# Patient Record
Sex: Female | Born: 1970 | Race: White | Hispanic: Yes | Smoking: Never smoker
Health system: Southern US, Community
[De-identification: ages and names within clinical notes are randomized; demographics above are authoritative.]

## PROBLEM LIST (undated history)

## (undated) DIAGNOSIS — G43909 Migraine, unspecified, not intractable, without status migrainosus: Secondary | ICD-10-CM

## (undated) DIAGNOSIS — Z87442 Personal history of urinary calculi: Secondary | ICD-10-CM

## (undated) DIAGNOSIS — I10 Essential (primary) hypertension: Secondary | ICD-10-CM

## (undated) DIAGNOSIS — F419 Anxiety disorder, unspecified: Secondary | ICD-10-CM

## (undated) DIAGNOSIS — R600 Localized edema: Secondary | ICD-10-CM

## (undated) DIAGNOSIS — C50919 Malignant neoplasm of unspecified site of unspecified female breast: Secondary | ICD-10-CM

## (undated) DIAGNOSIS — M549 Dorsalgia, unspecified: Secondary | ICD-10-CM

## (undated) DIAGNOSIS — R079 Chest pain, unspecified: Secondary | ICD-10-CM

## (undated) DIAGNOSIS — R7303 Prediabetes: Secondary | ICD-10-CM

## (undated) DIAGNOSIS — E559 Vitamin D deficiency, unspecified: Secondary | ICD-10-CM

## (undated) DIAGNOSIS — K5900 Constipation, unspecified: Secondary | ICD-10-CM

## (undated) DIAGNOSIS — R0602 Shortness of breath: Secondary | ICD-10-CM

## (undated) DIAGNOSIS — M2550 Pain in unspecified joint: Secondary | ICD-10-CM

## (undated) HISTORY — DX: Chest pain, unspecified: R07.9

## (undated) HISTORY — DX: Vitamin D deficiency, unspecified: E55.9

## (undated) HISTORY — PX: OTHER SURGICAL HISTORY: SHX169

## (undated) HISTORY — DX: Shortness of breath: R06.02

## (undated) HISTORY — DX: Migraine, unspecified, not intractable, without status migrainosus: G43.909

## (undated) HISTORY — DX: Dorsalgia, unspecified: M54.9

## (undated) HISTORY — DX: Malignant neoplasm of unspecified site of unspecified female breast: C50.919

## (undated) HISTORY — DX: Essential (primary) hypertension: I10

## (undated) HISTORY — DX: Localized edema: R60.0

## (undated) HISTORY — PX: ABDOMINAL HYSTERECTOMY: SHX81

## (undated) HISTORY — DX: Constipation, unspecified: K59.00

## (undated) HISTORY — DX: Pain in unspecified joint: M25.50

---

## 2016-05-05 ENCOUNTER — Ambulatory Visit: Payer: Self-pay | Admitting: Family Medicine

## 2017-10-21 DIAGNOSIS — G43909 Migraine, unspecified, not intractable, without status migrainosus: Secondary | ICD-10-CM | POA: Insufficient documentation

## 2017-11-18 DIAGNOSIS — E559 Vitamin D deficiency, unspecified: Secondary | ICD-10-CM | POA: Insufficient documentation

## 2017-11-18 DIAGNOSIS — R7303 Prediabetes: Secondary | ICD-10-CM | POA: Insufficient documentation

## 2017-11-18 DIAGNOSIS — I10 Essential (primary) hypertension: Secondary | ICD-10-CM | POA: Insufficient documentation

## 2018-06-08 DIAGNOSIS — K912 Postsurgical malabsorption, not elsewhere classified: Secondary | ICD-10-CM | POA: Insufficient documentation

## 2018-06-08 DIAGNOSIS — Z903 Acquired absence of stomach [part of]: Secondary | ICD-10-CM | POA: Insufficient documentation

## 2020-12-28 ENCOUNTER — Other Ambulatory Visit: Payer: Self-pay | Admitting: Internal Medicine

## 2020-12-28 DIAGNOSIS — Z1231 Encounter for screening mammogram for malignant neoplasm of breast: Secondary | ICD-10-CM

## 2021-01-28 ENCOUNTER — Encounter: Payer: Self-pay | Admitting: Family Medicine

## 2021-02-19 ENCOUNTER — Inpatient Hospital Stay: Admission: RE | Admit: 2021-02-19 | Payer: Self-pay | Source: Ambulatory Visit

## 2021-04-15 ENCOUNTER — Other Ambulatory Visit: Payer: Self-pay

## 2021-04-15 ENCOUNTER — Ambulatory Visit
Admission: RE | Admit: 2021-04-15 | Discharge: 2021-04-15 | Disposition: A | Discharge status: Home / Self Care | Payer: 59 | Source: Ambulatory Visit | Attending: Internal Medicine | Admitting: Internal Medicine

## 2021-04-15 DIAGNOSIS — Z1231 Encounter for screening mammogram for malignant neoplasm of breast: Secondary | ICD-10-CM

## 2021-04-16 ENCOUNTER — Other Ambulatory Visit: Payer: Self-pay | Admitting: Internal Medicine

## 2021-04-16 DIAGNOSIS — R928 Other abnormal and inconclusive findings on diagnostic imaging of breast: Secondary | ICD-10-CM

## 2021-05-07 ENCOUNTER — Ambulatory Visit: Payer: 59

## 2021-05-07 ENCOUNTER — Other Ambulatory Visit: Payer: Self-pay

## 2021-05-07 ENCOUNTER — Ambulatory Visit
Admission: RE | Admit: 2021-05-07 | Discharge: 2021-05-07 | Disposition: A | Discharge status: Home / Self Care | Payer: 59 | Source: Ambulatory Visit | Attending: Internal Medicine | Admitting: Internal Medicine

## 2021-05-07 ENCOUNTER — Other Ambulatory Visit: Payer: Self-pay | Admitting: Internal Medicine

## 2021-05-07 DIAGNOSIS — R928 Other abnormal and inconclusive findings on diagnostic imaging of breast: Secondary | ICD-10-CM

## 2021-05-07 DIAGNOSIS — R921 Mammographic calcification found on diagnostic imaging of breast: Secondary | ICD-10-CM

## 2021-05-15 ENCOUNTER — Ambulatory Visit
Admission: RE | Admit: 2021-05-15 | Discharge: 2021-05-15 | Disposition: A | Discharge status: Home / Self Care | Payer: 59 | Source: Ambulatory Visit | Attending: Internal Medicine | Admitting: Internal Medicine

## 2021-05-15 ENCOUNTER — Other Ambulatory Visit: Payer: Self-pay

## 2021-05-15 ENCOUNTER — Other Ambulatory Visit: Payer: Self-pay | Admitting: Internal Medicine

## 2021-05-15 DIAGNOSIS — R921 Mammographic calcification found on diagnostic imaging of breast: Secondary | ICD-10-CM

## 2021-05-15 HISTORY — PX: BREAST LUMPECTOMY: SHX2

## 2021-05-16 ENCOUNTER — Other Ambulatory Visit: Payer: Self-pay | Admitting: Internal Medicine

## 2021-05-16 ENCOUNTER — Encounter: Payer: Self-pay | Admitting: *Deleted

## 2021-05-16 DIAGNOSIS — D0512 Intraductal carcinoma in situ of left breast: Secondary | ICD-10-CM | POA: Insufficient documentation

## 2021-05-16 DIAGNOSIS — C50912 Malignant neoplasm of unspecified site of left female breast: Secondary | ICD-10-CM

## 2021-05-17 ENCOUNTER — Telehealth: Payer: Self-pay | Admitting: General Surgery

## 2021-05-17 ENCOUNTER — Other Ambulatory Visit: Payer: Self-pay | Admitting: Internal Medicine

## 2021-05-17 DIAGNOSIS — C50912 Malignant neoplasm of unspecified site of left female breast: Secondary | ICD-10-CM

## 2021-05-17 NOTE — Telephone Encounter (Signed)
Spoke to patient with an interpreter to confirm morning [facility] appointment on 6/8, paper work emailed to patient

## 2021-05-20 ENCOUNTER — Ambulatory Visit
Admission: RE | Admit: 2021-05-20 | Discharge: 2021-05-20 | Disposition: A | Discharge status: Home / Self Care | Payer: 59 | Source: Ambulatory Visit | Attending: Internal Medicine | Admitting: Internal Medicine

## 2021-05-20 ENCOUNTER — Other Ambulatory Visit: Payer: Self-pay

## 2021-05-20 ENCOUNTER — Other Ambulatory Visit: Payer: Self-pay | Admitting: *Deleted

## 2021-05-20 DIAGNOSIS — C50912 Malignant neoplasm of unspecified site of left female breast: Secondary | ICD-10-CM

## 2021-05-20 DIAGNOSIS — D0512 Intraductal carcinoma in situ of left breast: Secondary | ICD-10-CM

## 2021-05-20 MED ORDER — GADOBUTROL 1 MMOL/ML IV SOLN
10.0000 mL | Freq: Once | INTRAVENOUS | Status: AC | PRN
  Administered 2021-05-20: 10 mL via INTRAVENOUS

## 2021-05-20 NOTE — Progress Notes (Signed)
[place]   [phone] [phone] [place] Note   Patient Care Team: Pcp, No as PCP - General Rockwell Germany, RN as Oncology Nurse Navigator Mauro Kaufmann, RN as Oncology Nurse Navigator Truitt Merle, MD as Consulting Physician (Hematology) Stark Klein, MD as Consulting Physician (General Surgery) Eppie Gibson, MD as Attending Physician (Radiation Oncology)  Date of Service:  05/22/2021   CHIEF COMPLAINTS/PURPOSE OF CONSULTATION:  Newly diagnosed left breast DCIS   Oncology History Overview Note  Cancer Staging Ductal carcinoma in situ (DCIS) of left breast Staging form: Breast, AJCC 8th Edition - Clinical stage from 05/15/2021: Stage 0 (cTis (DCIS), cN0, cM0, G3, ER+, PR-, HER2: Not Assessed) - Signed by Truitt Merle, MD on 05/21/2021 Stage prefix: Initial diagnosis Histologic grading system: 3 grade system    Ductal carcinoma in situ (DCIS) of left breast  05/07/2021 Mammogram   IMPRESSION: 1. There is a 5 cm group of suspicious calcifications in the lower-inner quadrant of the left breast.   2. There are 2 small adjacent groups of calcifications in the upper-outer quadrant of the right breast measuring 0.6 and 0.5 cm respectively, which may represent fibrocystic changes.   05/15/2021 Initial Biopsy   Diagnosis 1. Breast, left, needle core biopsy, left breast LIQ anterior - DUCTAL CARCINOMA IN SITU WITH CALCIFICATIONS AND NECROSIS - SEE COMMENT 2. Breast, left, needle core biopsy, left breast LIQ posterior - DUCTAL CARCINOMA IN SITU WITH CALCIFICATIONS AND NECROSIS - SEE COMMENT Microscopic Comment 1. and 2. Based on the biopsy, the ductal carcinoma in situ has a comedo pattern, high nuclear grade and measures 0.2 cm in greatest linear extent. Prognostic markers (ER/PR) are pending and will be reported in an addendum. Dr. Saralyn Pilar reviewed the case and agrees with the above diagnosis. These results were called to [facility] on May 16, 2021.   05/15/2021 Cancer Staging   Staging form: Breast, AJCC 8th Edition - Clinical stage from 05/15/2021: Stage 0 (cTis (DCIS), cN0, cM0, G3, ER+, PR-, HER2: Not Assessed) - Signed by Truitt Merle, MD on 05/21/2021 Stage prefix: Initial diagnosis Histologic grading system: 3 grade system   05/15/2021 Receptors her2   ADDITIONAL INFORMATION: 1. PROGNOSTIC INDICATORS Results: IMMUNOHISTOCHEMICAL AND MORPHOMETRIC ANALYSIS PERFORMED MANUALLY Estrogen Receptor: 30%, POSITIVE, WEAK STAINING INTENSITY Progesterone Receptor: 0%, NEGATIVE   05/16/2021 Initial Diagnosis   Ductal carcinoma in situ (DCIS) of left breast   05/20/2021 Imaging   MRI breast IMPRESSION: Non masslike enhancement in the anterior aspect of the left breast consistent with post biopsy change/hematoma. No additional abnormal enhancement in the left breast is identified. No abnormal enhancement in the right breast.   05/23/2021 Pathology Results   PENDING RIGHT BREAST BIOPSY      HISTORY OF PRESENTING ILLNESS:  Destiny Little 50 y.o. female is a here because of newly diagnosed left breast DCIS. The patient presents to the Breast clinic today accompanied by Spanish interpretor Gregary Signs.   Her Left breast mass was found by screening mass. She did not feel the mass herself. She denies history of abnormal mammogram. She notes last year was her first mammogram. She denies family history of breast cancer. She is overall at baseline with no pain. She only notes "hot flashes" in her lower legs. She notes she is anxious about the unknown with this diagnosis. With more information she is calmer. She declined speaking with SW for now.   She denies significant PMHx other than HTN. I  reviewed her medication list with her. She has 3 C-sections and Bypass surgery 4 years ago. She was able to lose significant weight afterward. She had hysterectomy due to heavy bleeding and enlarged uterus. She still has ovaries. Her  mother passed at age 39 from medical neglect in [place].   Socially she is separated from her husband. She has 3 children. She does not smoke, drink. She works as a Secretary/administrator, currently at [employer]. She notes she has insurance.     GYN HISTORY  Menarchal: 13 LMP: S/p Hysterectomy, still has ovaries.  Contraceptive: No HRT: No G3P3: first at age 20, breast fed for 6-12 months.    REVIEW OF SYSTEMS:    Constitutional: Denies fevers, chills or abnormal night sweats Eyes: Denies blurriness of vision, double vision or watery eyes Ears, nose, mouth, throat, and face: Denies mucositis or sore throat Respiratory: Denies cough, dyspnea or wheezes Cardiovascular: Denies palpitation, chest discomfort or lower extremity swelling Gastrointestinal:  Denies nausea, heartburn or change in bowel habits Skin: Denies abnormal skin rashes Lymphatics: Denies new lymphadenopathy or easy bruising Neurological:Denies numbness, tingling or new weaknesses Behavioral/Psych: Mood is stable, no new changes  All other systems were reviewed with the patient and are negative.   MEDICAL HISTORY:  Past Medical History:  Diagnosis Date  . Breast cancer ([place])   . Hypertension   . Migraines     SURGICAL HISTORY: Past Surgical History:  Procedure Laterality Date  . ABDOMINAL HYSTERECTOMY    . CESAREAN SECTION     x3  . gastic bypass      SOCIAL HISTORY: Social History   Socioeconomic History  . Marital status: Legally Separated    Spouse name: Not on file  . Number of children: 3  . Years of education: Not on file  . Highest education level: Not on file  Occupational History  . Occupation: house keeping     Employer: [employer]  Tobacco Use  . Smoking status: Never Smoker  . Smokeless tobacco: Never Used  Substance and Sexual Activity  . Alcohol use: Never  . Drug use: Never  . Sexual activity: Not on file  Other Topics Concern  . Not on file  Social History Narrative  .  Not on file   Social Determinants of Health   Financial Resource Strain: Not on file  Food Insecurity: Not on file  Transportation Needs: Not on file  Physical Activity: Not on file  Stress: Not on file  Social Connections: Not on file  Intimate Partner Violence: Not on file    FAMILY HISTORY: Family History  Problem Relation Age of Onset  . Diabetes Father   . Hypertension Father     ALLERGIES:  has no allergies on file.  MEDICATIONS:  Current Outpatient Medications  Medication Sig Dispense Refill  . ibuprofen (ADVIL) 800 MG tablet Take 800 mg by mouth 3 (three) times daily as needed.    Marland Kitchen losartan-hydrochlorothiazide (HYZAAR) 50-12.5 MG tablet Take 1 tablet by mouth daily.     No current facility-administered medications for this visit.    PHYSICAL EXAMINATION: ECOG PERFORMANCE STATUS: 0 - Asymptomatic  Vitals:   05/22/21 0906  BP: 130/76  Pulse: 66  Resp: 18  Temp: 97.7 F (36.5 C)  SpO2: 100%   Filed Weights   05/22/21 0906  Weight: 183 lb 14.4 oz (83.4 kg)    GENERAL:alert, no distress and comfortable SKIN: skin color, texture, turgor are normal, no rashes or significant lesions EYES: normal, Conjunctiva  are pink and non-injected, sclera clear  NECK: supple, thyroid normal size, non-tender, without nodularity LYMPH:  no palpable lymphadenopathy in the cervical, axillary  LUNGS: clear to auscultation and percussion with normal breathing effort HEART: regular rate & rhythm and no murmurs and no lower extremity edema ABDOMEN:abdomen soft, non-tender and normal bowel sounds Musculoskeletal:no cyanosis of digits and no clubbing  NEURO: alert & oriented x 3 with fluent speech, no focal motor/sensory deficits BREAST: (+) Moderate skin ecchymosis at left breast biopsy with 2.5x1.5cm mass at 9:00 position lump, likely hematoma. Right Breast exam benign.  LABORATORY DATA:  I have reviewed the data as listed CBC Latest Ref Rng & Units 05/22/2021  WBC 4.0 - 10.5  K/uL 6.9  Hemoglobin 12.0 - 15.0 g/dL 12.8  Hematocrit 36.0 - 46.0 % 37.8  Platelets 150 - 400 K/uL 483(H)    CMP Latest Ref Rng & Units 05/22/2021  Glucose 70 - 99 mg/dL 140(H)  BUN 6 - 20 mg/dL 12  Creatinine 0.44 - 1.00 mg/dL 0.79  Sodium 135 - 145 mmol/L 139  Potassium 3.5 - 5.1 mmol/L 4.0  Chloride 98 - 111 mmol/L 104  CO2 22 - 32 mmol/L 24  Calcium 8.9 - 10.3 mg/dL 9.6  Total Protein 6.5 - 8.1 g/dL 7.6  Total Bilirubin 0.3 - 1.2 mg/dL 0.7  Alkaline Phos 38 - 126 U/L 64  AST 15 - 41 U/L 17  ALT 0 - 44 U/L 13     RADIOGRAPHIC STUDIES: I have personally reviewed the radiological images as listed and agreed with the findings in the report. MR BREAST BILATERAL W WO CONTRAST INC CAD  Result Date: 05/20/2021 CLINICAL DATA:  50 year old female with 2 areas of biopsy proven high-grade ductal carcinoma in-situ with calcifications and necrosis in the medial aspect of the left breast. Patient developed a moderate size hematoma following the biopsies. Two additional stereotactic biopsies of the right breast are scheduled on 05/23/2021. LABS:  None obtained at the time of imaging. EXAM: BILATERAL BREAST MRI WITH AND WITHOUT CONTRAST TECHNIQUE: Multiplanar, multisequence MR images of both breasts were obtained prior to and following the intravenous administration of 10 ml of Gadavist Three-dimensional MR images were rendered by post-processing of the original MR data on an independent workstation. The three-dimensional MR images were interpreted, and findings are reported in the following complete MRI report for this study. Three dimensional images were evaluated at the independent interpreting workstation using the DynaCAD thin client. COMPARISON:  Previous exam(s). FINDINGS: Breast composition: b. Scattered fibroglandular tissue. Background parenchymal enhancement: Moderate. Right breast: No mass or abnormal enhancement. Left breast: Biopsy changes are seen in the anterior aspect of the left breast.  There are 2 signal void artifacts anteriorly in the breast from the biopsies. There is asymmetric non masslike enhancement in the anterior aspect of the breast likely related to post biopsy change/hematoma. No additional abnormal enhancement is seen in the left breast. Lymph nodes: No abnormal appearing lymph nodes. Ancillary findings:  None. IMPRESSION: Non masslike enhancement in the anterior aspect of the left breast consistent with post biopsy change/hematoma. No additional abnormal enhancement in the left breast is identified. No abnormal enhancement in the right breast. RECOMMENDATION: Stereotactic biopsies of the right breast has been scheduled on 05/23/2021 for indeterminate calcifications. 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 BI-RADS CATEGORY  6: Known biopsy-proven malignancy. Electronically Signed   By: Lillia Mountain M.D.   On: 05/20/2021 12:39   MM DIAG  BREAST TOMO BILATERAL  Result Date: 05/07/2021 CLINICAL DATA:  Screening recall for bilateral breast calcifications. EXAM: DIGITAL DIAGNOSTIC BILATERAL MAMMOGRAM WITH TOMOSYNTHESIS AND CAD TECHNIQUE: Bilateral digital diagnostic mammography and breast tomosynthesis was performed. The images were evaluated with computer-aided detection. COMPARISON:  Previous exam(s). ACR Breast Density Category b: There are scattered areas of fibroglandular density. FINDINGS: In the lower-inner quadrant of the left breast, anterior depth, there is a 5 cm group of segmental amorphous and coarse calcifications. In the upper outer posterior right breast, spot compression magnification images reveal 2 adjacent groups of amorphous calcifications measuring 0.6 and 0.5 cm respectively. IMPRESSION: 1. There is a 5 cm group of suspicious calcifications in the lower-inner quadrant of the left breast. 2. There are 2 small adjacent groups of calcifications in the upper-outer quadrant of the right breast, which may  represent fibrocystic changes. RECOMMENDATION: Stereotactic biopsy is recommended for the anterior and posterior margin of calcifications in the lower-inner left breast. If the biopsies are benign, than six-month follow-up right breast diagnostic mammogram is recommended to monitor the other 2 groups. If the biopsy is abnormal, than a double stereotactic biopsy of the right breast is recommended. The left breast biopsies have been scheduled for 05/15/2021 at 9:30 a.m. I have discussed the findings and recommendations with the patient. If applicable, a reminder letter will be sent to the patient regarding the next appointment. BI-RADS CATEGORY  4: Suspicious. Electronically Signed   By: Ammie Ferrier M.D.   On: 05/07/2021 15:00   MM CLIP PLACEMENT LEFT  Result Date: 05/15/2021 CLINICAL DATA:  Status post stereotactic biopsies of the left breast. EXAM: DIAGNOSTIC LEFT MAMMOGRAM POST STEREOTACTIC BIOPSIES COMPARISON:  Previous exam(s). FINDINGS: Mammographic images were obtained following stereotactic guided biopsies of the left breast. There is a ribbon shaped clip in the lower-inner quadrant of the left breast located approximately 1.5 cm lateral from the biopsied calcifications. There is a X shaped clip in appropriate position in the lower-inner quadrant of the left breast. The clips are located approximately 2.7 cm part. IMPRESSION: The ribbon shaped clip is located approximately 1.5 cm lateral to the biopsied calcifications. The X shaped clip is felt to be in good position. Final Assessment: Post Procedure Mammograms for Marker Placement Electronically Signed   By: Lillia Mountain M.D.   On: 05/15/2021 11:06   MM LT BREAST BX W LOC DEV 1ST LESION IMAGE BX SPEC STEREO GUIDE  Addendum Date: 05/20/2021   ADDENDUM REPORT: 05/17/2021 08:18 ADDENDUM: Pathology revealed HIGH GRADE DUCTAL CARCINOMA IN SITU WITH CALCIFICATIONS AND NECROSIS of the LEFT breast, lower inner quadrant, anterior, ribbon clip. This was  found to be concordant by Dr. Lillia Mountain. Pathology revealed HIGH GRADE DUCTAL CARCINOMA IN SITU WITH CALCIFICATIONS AND NECROSIS of the LEFT breast, lower inner quadrant, posterior, X clip. This was found to be concordant by Dr. Lillia Mountain. Pathology results were discussed with the patient by telephone with Kathrine Haddock- Bilingual Patient Services Representative. The patient reported doing well after the biopsies with tenderness at the sites. Post biopsy instructions and care were reviewed and questions were answered. The patient was encouraged to call [facility] for any additional concerns. The patient was referred to [facility] on May 22, 2021. The patient is scheduled 05/23/2021 for additional stereo biopsies of RIGHT breast. Consider MRI given high grade histology. Pathology results reported by Stacie Acres RN on 05/17/2021. Electronically Signed   By: Mordecai Rasmussen  Arceo M.D.   On: 05/17/2021 08:18   Result Date: 05/20/2021 CLINICAL DATA:  Suspicious left breast calcifications. Biopsy of the anterior and posterior extent of the calcifications recommended. EXAM: LEFT BREAST STEREOTACTIC CORE NEEDLE BIOPSIES COMPARISON:  Previous exams. FINDINGS: The patient and I discussed the procedure of stereotactic-guided biopsy including benefits and alternatives. We discussed the high likelihood of a successful procedure. We discussed the risks of the procedure including infection, bleeding, tissue injury, clip migration, and inadequate sampling. Informed written consent was given. The usual time out protocol was performed immediately prior to the procedure. Using sterile technique and 1% Lidocaine as local anesthetic, under stereotactic guidance, a 9 gauge vacuum assisted device was used to perform core needle biopsy of calcifications in the lower-inner quadrant of the left breast (anterior) using a medial to lateral  approach. Specimen radiograph was performed showing calcifications are in the tissue samples. Specimens with calcifications are identified for pathology. Lesion quadrant: Lower inner quadrant (anterior) At the conclusion of the procedure, ribbon shaped tissue marker clip was deployed into the biopsy cavity. Follow-up 2-view mammogram was performed and dictated separately. The patient and I discussed the procedure of stereotactic-guided biopsy including benefits and alternatives. We discussed the high likelihood of a successful procedure. We discussed the risks of the procedure including infection, bleeding, tissue injury, clip migration, and inadequate sampling. Informed written consent was given. The usual time out protocol was performed immediately prior to the procedure. Using sterile technique and 1% Lidocaine as local anesthetic, under stereotactic guidance, a 9 gauge vacuum assisted device was used to perform core needle biopsy of calcifications in the lower-inner quadrant of the left breast (posterior) using a medial to lateral approach. Specimen radiograph was performed showing calcifications are in the tissue samples. Specimens with calcifications are identified for pathology. Lesion quadrant: Lower inner quadrant (posterior) At the conclusion of the procedure, X shaped tissue marker clip was deployed into the biopsy cavity. Follow-up 2-view mammogram was performed and dictated separately. IMPRESSION: Stereotactic-guided biopsies of calcifications in the left breast. No apparent complications. Electronically Signed: By: Lillia Mountain M.D. On: 05/15/2021 10:41   MM LT BREAST BX W LOC DEV EA AD LESION IMG BX SPEC STEREO GUIDE  Addendum Date: 05/20/2021   ADDENDUM REPORT: 05/17/2021 08:18 ADDENDUM: Pathology revealed HIGH GRADE DUCTAL CARCINOMA IN SITU WITH CALCIFICATIONS AND NECROSIS of the LEFT breast, lower inner quadrant, anterior, ribbon clip. This was found to be concordant by Dr. Lillia Mountain. Pathology  revealed HIGH GRADE DUCTAL CARCINOMA IN SITU WITH CALCIFICATIONS AND NECROSIS of the LEFT breast, lower inner quadrant, posterior, X clip. This was found to be concordant by Dr. Lillia Mountain. Pathology results were discussed with the patient by telephone with Kathrine Haddock- Bilingual Patient Services Representative. The patient reported doing well after the biopsies with tenderness at the sites. Post biopsy instructions and care were reviewed and questions were answered. The patient was encouraged to call [facility] for any additional concerns. The patient was referred to [facility] on May 22, 2021. The patient is scheduled 05/23/2021 for additional stereo biopsies of RIGHT breast. Consider MRI given high grade histology. Pathology results reported by Stacie Acres RN on 05/17/2021. Electronically Signed   By: Lillia Mountain M.D.   On: 05/17/2021 08:18   Result Date: 05/20/2021 CLINICAL DATA:  Suspicious left breast calcifications. Biopsy of the anterior and posterior extent of the calcifications recommended. EXAM: LEFT BREAST STEREOTACTIC CORE  NEEDLE BIOPSIES COMPARISON:  Previous exams. FINDINGS: The patient and I discussed the procedure of stereotactic-guided biopsy including benefits and alternatives. We discussed the high likelihood of a successful procedure. We discussed the risks of the procedure including infection, bleeding, tissue injury, clip migration, and inadequate sampling. Informed written consent was given. The usual time out protocol was performed immediately prior to the procedure. Using sterile technique and 1% Lidocaine as local anesthetic, under stereotactic guidance, a 9 gauge vacuum assisted device was used to perform core needle biopsy of calcifications in the lower-inner quadrant of the left breast (anterior) using a medial to lateral approach. Specimen radiograph was performed showing  calcifications are in the tissue samples. Specimens with calcifications are identified for pathology. Lesion quadrant: Lower inner quadrant (anterior) At the conclusion of the procedure, ribbon shaped tissue marker clip was deployed into the biopsy cavity. Follow-up 2-view mammogram was performed and dictated separately. The patient and I discussed the procedure of stereotactic-guided biopsy including benefits and alternatives. We discussed the high likelihood of a successful procedure. We discussed the risks of the procedure including infection, bleeding, tissue injury, clip migration, and inadequate sampling. Informed written consent was given. The usual time out protocol was performed immediately prior to the procedure. Using sterile technique and 1% Lidocaine as local anesthetic, under stereotactic guidance, a 9 gauge vacuum assisted device was used to perform core needle biopsy of calcifications in the lower-inner quadrant of the left breast (posterior) using a medial to lateral approach. Specimen radiograph was performed showing calcifications are in the tissue samples. Specimens with calcifications are identified for pathology. Lesion quadrant: Lower inner quadrant (posterior) At the conclusion of the procedure, X shaped tissue marker clip was deployed into the biopsy cavity. Follow-up 2-view mammogram was performed and dictated separately. IMPRESSION: Stereotactic-guided biopsies of calcifications in the left breast. No apparent complications. Electronically Signed: By: Lillia Mountain M.D. On: 05/15/2021 10:41    ASSESSMENT & PLAN:  Destiny Little is a 50 y.o. [place] female with a history of HTN   1. Left breast DCIS, grade III, ER+/PR-  -I discussed her breast imaging and needle biopsy results with patient and her family members in great detail. She was found to have 5cm calcium deposits in her left breast. Biopsy showed this to be Ductal carcinoma IN SITU, high grad, ER 30%+ -She is a candidate  for breast conservation surgery. She has been seen by breast surgeon Dr. Barry Dienes, who recommends lumpectomy, but will wait for genetics testing result to see if she needs double mastectomy.  -Her DCIS will be cured by complete surgical resection. Any form of adjuvant therapy is preventive. -I reviewed her risk and treatment benefits using the Breast Cancer Nomogram from [facility]). Based on family, PMx and lifestyle she has a 17% risk of developing future breast cancer in the next 10 years. Her risk would drop to 7-8% with RT or Antiestrogen therapy alone. With both adjuvant treatments her risk would decrease to 3%.  -Given her positive ER in DCIS and her young age, I do recommend antiestrogen therapy with Tamoxifen, which decrease her risk of future breast cancer by half.  -She will likely benefit from breast radiation if she undergo lumpectomy to decrease the risk of breast cancer. She will discuss this further with Dr. Isidore Moos today.  -We also discussed that biopsy may have sampling limitation, we will review her surgical path, to see if she has any invasive carcinoma components. -She notes she is open to both  adjuvant Radiation and Antiestrogen therapy.  -We also discussed the breast cancer surveillance after her surgery. She will continue annual screening mammogram, self exams, and a routine office visit with lab and exam with [place]. I discussed the option of additional screening with annual breast MRIs. I also discussed Abbreviated MRIs which have $400 out-of-pocket cost if insurance does not cover this. -Labs today show CBC and CMP WNL except plt 483K, BG 140.  -Proceed with surgery soon. F/u after surgery or Radiation.  -she is scheduled for right breast biopsy soon, if it turned out to be DCIS, the treatment will be similar to left breast DCIS   2. Genetics  -Given her age, she is eligible for genetic testing. She is interested. -I discussed if she is found to be  BRCA+, Prophylactic b/l breast mastectomy would be recommended.    PLAN:  -Proceed with surgery soon  -F/u after surgery or Radiation    No orders of the defined types were placed in this encounter.   All questions were answered. The patient knows to call the clinic with any problems, questions or concerns. The total time spent in the appointment was 45 minutes.     Truitt Merle, MD 05/22/2021 10:23 AM  I, Joslyn Devon, am acting as scribe for Truitt Merle, MD.   I have reviewed the above documentation for accuracy and completeness, and I agree with the above.

## 2021-05-22 ENCOUNTER — Inpatient Hospital Stay: Payer: 59 | Attending: Hematology | Admitting: Hematology

## 2021-05-22 ENCOUNTER — Encounter: Payer: Self-pay | Admitting: *Deleted

## 2021-05-22 ENCOUNTER — Encounter: Payer: Self-pay | Admitting: Hematology

## 2021-05-22 ENCOUNTER — Ambulatory Visit (HOSPITAL_BASED_OUTPATIENT_CLINIC_OR_DEPARTMENT_OTHER): Payer: 59 | Admitting: Genetic Counselor

## 2021-05-22 ENCOUNTER — Encounter: Payer: Self-pay | Admitting: Radiation Oncology

## 2021-05-22 ENCOUNTER — Ambulatory Visit
Admission: RE | Admit: 2021-05-22 | Discharge: 2021-05-22 | Disposition: A | Discharge status: Home / Self Care | Payer: 59 | Source: Ambulatory Visit | Attending: Radiation Oncology | Admitting: Radiation Oncology

## 2021-05-22 ENCOUNTER — Inpatient Hospital Stay: Payer: 59

## 2021-05-22 ENCOUNTER — Encounter: Payer: Self-pay | Admitting: General Practice

## 2021-05-22 ENCOUNTER — Other Ambulatory Visit: Payer: Self-pay

## 2021-05-22 ENCOUNTER — Encounter: Payer: Self-pay | Admitting: Licensed Clinical Social Worker

## 2021-05-22 VITALS — BP 130/76 | HR 66 | Temp 97.7°F | Resp 18 | Wt 183.9 lb

## 2021-05-22 DIAGNOSIS — Z170 Estrogen receptor positive status [ER+]: Secondary | ICD-10-CM | POA: Diagnosis not present

## 2021-05-22 DIAGNOSIS — I10 Essential (primary) hypertension: Secondary | ICD-10-CM | POA: Diagnosis not present

## 2021-05-22 DIAGNOSIS — D0512 Intraductal carcinoma in situ of left breast: Secondary | ICD-10-CM | POA: Diagnosis present

## 2021-05-22 LAB — CBC WITH DIFFERENTIAL (CANCER CENTER ONLY)
Abs Immature Granulocytes: 0.02 10*3/uL (ref 0.00–0.07)
Basophils Absolute: 0 10*3/uL (ref 0.0–0.1)
Basophils Relative: 0 %
Eosinophils Absolute: 0 10*3/uL (ref 0.0–0.5)
Eosinophils Relative: 1 %
HCT: 37.8 % (ref 36.0–46.0)
Hemoglobin: 12.8 g/dL (ref 12.0–15.0)
Immature Granulocytes: 0 %
Lymphocytes Relative: 42 %
Lymphs Abs: 2.9 10*3/uL (ref 0.7–4.0)
MCH: 30.9 pg (ref 26.0–34.0)
MCHC: 33.9 g/dL (ref 30.0–36.0)
MCV: 91.3 fL (ref 80.0–100.0)
Monocytes Absolute: 0.4 10*3/uL (ref 0.1–1.0)
Monocytes Relative: 6 %
Neutro Abs: 3.6 10*3/uL (ref 1.7–7.7)
Neutrophils Relative %: 51 %
Platelet Count: 483 10*3/uL — ABNORMAL HIGH (ref 150–400)
RBC: 4.14 MIL/uL (ref 3.87–5.11)
RDW: 12.9 % (ref 11.5–15.5)
WBC Count: 6.9 10*3/uL (ref 4.0–10.5)
nRBC: 0 % (ref 0.0–0.2)

## 2021-05-22 LAB — CMP (CANCER CENTER ONLY)
ALT: 13 U/L (ref 0–44)
AST: 17 U/L (ref 15–41)
Albumin: 3.9 g/dL (ref 3.5–5.0)
Alkaline Phosphatase: 64 U/L (ref 38–126)
Anion gap: 11 (ref 5–15)
BUN: 12 mg/dL (ref 6–20)
CO2: 24 mmol/L (ref 22–32)
Calcium: 9.6 mg/dL (ref 8.9–10.3)
Chloride: 104 mmol/L (ref 98–111)
Creatinine: 0.79 mg/dL (ref 0.44–1.00)
GFR, Estimated: >60 mL/min (ref >60)
Glucose, Bld: 140 mg/dL — ABNORMAL HIGH (ref 70–99)
Potassium: 4 mmol/L (ref 3.5–5.1)
Sodium: 139 mmol/L (ref 135–145)
Total Bilirubin: 0.7 mg/dL (ref 0.3–1.2)
Total Protein: 7.6 g/dL (ref 6.5–8.1)

## 2021-05-22 LAB — GENETIC SCREENING ORDER

## 2021-05-22 NOTE — Progress Notes (Signed)
[facility] CSW Progress Notes  Call from daughter, Floreen Comber.  States mother will be out of work due to cancer diagnosis and treatment (surgery and radiation are planned).  This will result in loss of income as she does not have any short term disability or paid leave at her job.  She will experience financial distress.  Discussed various options - patient can apply for [employer] when she begins radiation treatment.  She can apply for various grants from agencies outside of [place] at this time - Restaurant manager, fast food and mailed this information to daughter Solicitor, [place], [place] in [place]).  Daughter will help mother review applications and assemble needed paperwork.  CSW will assist with submitting these applications when family is able to assemble needed supporting documentation.  Edwyna Shell, LCSW Clinical Social Worker Phone:  [phone]

## 2021-05-22 NOTE — Progress Notes (Signed)
REFERRING PROVIDER: Truitt Merle, MD [place],  [place] [address]  PRIMARY PROVIDER:  No PCP listed  PRIMARY REASON FOR VISIT:  1. Ductal carcinoma in situ (DCIS) of left breast     HISTORY OF PRESENT ILLNESS:   [name], a 50 y.o. female, was seen for a [place] cancer genetics consultation during the breast multidisciplinary clinic at the request of Dr. Burr Medico due to a personal history of cancer.  [name] presents to clinic today to discuss the possibility of a hereditary predisposition to cancer, to discuss genetic testing, and to further clarify her future cancer risks, as well as potential cancer risks for family members.   In 2022, at the age of 70, Destiny Little was diagnosed with ductal carcinoma in situ of the left breast.  Surgery plan is pending.  Biopsy of right breast is scheduled due to calcifications detected by imaging.   CANCER HISTORY:  Oncology History Overview Note  Cancer Staging Ductal carcinoma in situ (DCIS) of left breast Staging form: Breast, AJCC 8th Edition - Clinical stage from 05/15/2021: Stage 0 (cTis (DCIS), cN0, cM0, G3, ER+, PR-, HER2: Not Assessed) - Signed by Truitt Merle, MD on 05/21/2021 Stage prefix: Initial diagnosis Histologic grading system: 3 grade system    Ductal carcinoma in situ (DCIS) of left breast  05/07/2021 Mammogram   IMPRESSION: 1. There is a 5 cm group of suspicious calcifications in the lower-inner quadrant of the left breast.   2. There are 2 small adjacent groups of calcifications in the upper-outer quadrant of the right breast measuring 0.6 and 0.5 cm respectively, which may represent fibrocystic changes.   05/15/2021 Initial Biopsy   Diagnosis 1. Breast, left, needle core biopsy, left breast LIQ anterior - DUCTAL CARCINOMA IN SITU WITH CALCIFICATIONS AND NECROSIS - SEE COMMENT 2. Breast, left, needle core biopsy, left breast LIQ posterior - DUCTAL CARCINOMA IN SITU WITH CALCIFICATIONS AND NECROSIS -  SEE COMMENT Microscopic Comment 1. and 2. Based on the biopsy, the ductal carcinoma in situ has a comedo pattern, high nuclear grade and measures 0.2 cm in greatest linear extent. Prognostic markers (ER/PR) are pending and will be reported in an addendum. Dr. Saralyn Pilar reviewed the case and agrees with the above diagnosis. These results were called to [facility] on May 16, 2021.   05/15/2021 Cancer Staging   Staging form: Breast, AJCC 8th Edition - Clinical stage from 05/15/2021: Stage 0 (cTis (DCIS), cN0, cM0, G3, ER+, PR-, HER2: Not Assessed) - Signed by Truitt Merle, MD on 05/21/2021 Stage prefix: Initial diagnosis Histologic grading system: 3 grade system   05/15/2021 Receptors her2   ADDITIONAL INFORMATION: 1. PROGNOSTIC INDICATORS Results: IMMUNOHISTOCHEMICAL AND MORPHOMETRIC ANALYSIS PERFORMED MANUALLY Estrogen Receptor: 30%, POSITIVE, WEAK STAINING INTENSITY Progesterone Receptor: 0%, NEGATIVE   05/16/2021 Initial Diagnosis   Ductal carcinoma in situ (DCIS) of left breast   05/20/2021 Imaging   MRI breast IMPRESSION: Non masslike enhancement in the anterior aspect of the left breast consistent with post biopsy change/hematoma. No additional abnormal enhancement in the left breast is identified. No abnormal enhancement in the right breast.   05/23/2021 Pathology Results   PENDING RIGHT BREAST BIOPSY     RISK FACTORS:  Menarche was at age 71.  First live birth at age 67.  OCP use for approximately 0 years.  Ovaries intact: yes.  Hysterectomy: yes in 2012  HRT use: 0 years. Colonoscopy: no; not examined. Mammogram within the last year: yes. Up to date  with pelvic exams: yes; most recent PAP in 2021  Past Medical History:  Diagnosis Date  . Breast cancer ([place])   . Hypertension   . Migraines     Past Surgical History:  Procedure Laterality Date  . ABDOMINAL HYSTERECTOMY    . CESAREAN SECTION     x3  . gastic bypass      Social History    Socioeconomic History  . Marital status: Legally Separated    Spouse name: Not on file  . Number of children: 3  . Years of education: Not on file  . Highest education level: Not on file  Occupational History  . Occupation: house keeping     Employer: [employer]  Tobacco Use  . Smoking status: Never Smoker  . Smokeless tobacco: Never Used  Substance and Sexual Activity  . Alcohol use: Never  . Drug use: Never  . Sexual activity: Not on file  Other Topics Concern  . Not on file  Social History Narrative  . Not on file   Social Determinants of Health   Financial Resource Strain: Not on file  Food Insecurity: No Food Insecurity  . Worried About Charity fundraiser in the Last Year: Never true  . Ran Out of Food in the Last Year: Never true  Transportation Needs: No Transportation Needs  . Lack of Transportation (Medical): No  . Lack of Transportation (Non-Medical): No  Physical Activity: Not on file  Stress: Not on file  Social Connections: Not on file     FAMILY HISTORY:  We obtained a detailed, 4-generation family history.  Significant diagnoses are listed below: Family History  Problem Relation Age of Onset  . Diabetes Father   . Hypertension Father     Destiny Little did not report a history of cancer; however, she stated that many family members have not had access to healthcare. Thus, she may not be informed of the cancer status of her family members.    [name] is unaware of previous family history of genetic testing for hereditary cancer risks. Patient's maternal and paternal ancestors are from [place]  There is no reported Ashkenazi Jewish ancestry. There is no known consanguinity.  GENETIC COUNSELING ASSESSMENT: [name] is a 50 y.o. female with a personal history of cancer which is somewhat suggestive of a hereditary cancer syndrome and predisposition to cancer given her age and limited information about cancer family history. We,  therefore, discussed and recommended the following at today's visit.   DISCUSSION: We discussed that 5 - 10% of cancer is hereditary, with most cases of hereditary breast cancer associated with mutations in BRCA1/2.  There are other genes that can be associated with hereditary breast  cancer syndromes.  Type of cancer risk and level of risk are gene-specific. We discussed that testing is beneficial for several reasons including knowing how to follow individuals after completing their treatment, identifying whether potential treatment options would be beneficial, and understanding if other family members could be at risk for cancer and allowing them to undergo genetic testing.   We reviewed the characteristics, features and inheritance patterns of hereditary cancer syndromes. We also discussed genetic testing, including the appropriate family members to test, the process of testing, insurance coverage and turn-around-time for results. We discussed the implications of a negative, positive and/or variant of uncertain significant result. In order to get genetic test results in a timely manner so that Destiny Little can use these genetic test results for surgical decisions,  we recommended Destiny Little pursue genetic testing for the [facility] BRCAPlus Panel.  The BRCAplus panel offered by [facility] and includes sequencing and deletion/duplication analysis for the following 8 genes: ATM, BRCA1, BRCA2, CDH1, CHEK2, PALB2, PTEN, and TP53.  Once complete, we recommend Destiny Little pursue reflex genetic testing to a more comprehensive gene panel.   The CancerNext-Expanded gene panel offered by [facility] and includes sequencing, rearrangement, and RNA analysis for the following 77 genes: AIP, ALK, APC, ATM, AXIN2, BAP1, BARD1, BLM, BMPR1A, BRCA1, BRCA2, BRIP1, CDC73, CDH1, CDK4, CDKN1B, CDKN2A, CHEK2, CTNNA1, DICER1, FANCC, FH, FLCN, GALNT12, KIF1B, LZTR1, MAX, MEN1, MET, MLH1, MSH2, MSH3, MSH6, MUTYH, NBN, NF1, NF2,  NTHL1, PALB2, PHOX2B, PMS2, POT1, PRKAR1A, PTCH1, PTEN, RAD51C, RAD51D, RB1, RECQL, RET, SDHA, SDHAF2, SDHB, SDHC, SDHD, SMAD4, SMARCA4, SMARCB1, SMARCE1, STK11, SUFU, TMEM127, TP53, TSC1, TSC2, VHL and XRCC2 (sequencing and deletion/duplication); EGFR, EGLN1, HOXB13, KIT, MITF, PDGFRA, POLD1, and POLE (sequencing only); EPCAM and GREM1 (deletion/duplication only).   Based on Destiny Little's personal history of cancer before the age of 27 and limited information about cancer history in family members, she meets medical criteria for genetic testing. Despite that she meets criteria, she may still have an out of pocket cost. We discussed that if her out of pocket cost for testing is over $100, the laboratory should contact her to discuss self-pay prices, patient pay assistance programs, if applicable, and other billing options.   PLAN: After considering the risks, benefits, and limitations, Destiny Little provided informed consent to pursue genetic testing and the blood sample was sent to [facility] for analysis of the BRCAPlus + CancerNext-Expanded +RNAinsight Panels. Results should be available within approximately 1-2 weeks' time, at which point they will be disclosed by telephone to Destiny Little, as will any additional recommendations warranted by these results. [name] will receive a summary of her genetic counseling visit and a copy of her results once available. This information will also be available in Epic.   Lastly, we encouraged Destiny Little to remain in contact with cancer genetics annually so that we can continuously update the family history and inform her of any changes in cancer genetics and testing that may be of benefit for this family.   Ms. Jonny Ruiz questions were answered to her satisfaction today. Our contact information was provided should additional questions or concerns arise. Thank you for the referral and allowing [place] to share in the care of your patient.   Lyndal Reggio M.  Joette Catching, [place], [facility] Genetic Counselor [email or web address] (P) [phone]  The patient was seen for a total of 20 minutes in face-to-face genetic counseling.  The patient was seen alone with [place] interpreter, Almyra Free.  Drs. Magrinat, Lindi Adie and/or Burr Medico were available to discuss this case as needed.  _______________________________________________________________________ For Office Staff:  Number of people involved in session: 1 Was an Intern/ student involved with case: no

## 2021-05-22 NOTE — Progress Notes (Signed)
Radiation Oncology         [phone]) [phone] ________________________________  Initial outpatient Consultation  Name: Destiny Little MRN: [id number]  Date: 05/22/2021  DOB: 10-07-1971  CC:Pcp, No  Stark Klein, MD   REFERRING PHYSICIAN: Stark Klein, MD  DIAGNOSIS:    ICD-10-CM   1. Ductal carcinoma in situ (DCIS) of left breast  D05.12    Cancer Staging Ductal carcinoma in situ (DCIS) of left breast Staging form: Breast, AJCC 8th Edition - Clinical stage from 05/15/2021: Stage 0 (cTis (DCIS), cN0, cM0, G3, ER+, PR-, HER2: Not Assessed) - Signed by Truitt Merle, MD on 05/21/2021 Stage prefix: Initial diagnosis Histologic grading system: 3 grade system   CHIEF COMPLAINT: Here to discuss management of left breast DCIS, possible right breast DCIS  HISTORY OF PRESENT ILLNESS::Destiny Little is a 50 y.o. female who presented with breast abnormality on the following imaging: Mammography, which revealed bilateral breast calcifications.  In the left breast the suspicious calcifications extend over a 5 cm span in the lower inner quadrant.  In the right breast there are 2 small adjacent groups of calcifications, upper outer quadrant Symptoms, if any, at that time, were: none.   Biopsy of the left breast lower inner quadrant revealed ductal carcinoma in situ with calcifications and necrosis, ER 30% weak positive, PR negative, high-grade.  She is here with an interpreter today.  She works in [employer] for a [employer].  She has 3 children.  Two live in [place] and 1 lives in [place].   PREVIOUS RADIATION THERAPY: No  PAST MEDICAL HISTORY:  has a past medical history of Breast cancer ([place]), Hypertension, and Migraines.    PAST SURGICAL HISTORY: Past Surgical History:  Procedure Laterality Date  . ABDOMINAL HYSTERECTOMY    . CESAREAN SECTION     x3  . gastic bypass      FAMILY HISTORY: family history includes Diabetes in her father; Hypertension in her  father.  SOCIAL HISTORY:  reports that she has never smoked. She has never used smokeless tobacco. She reports that she does not drink alcohol and does not use drugs.  ALLERGIES: Patient has no known allergies.  MEDICATIONS:  Current Outpatient Medications  Medication Sig Dispense Refill  . ibuprofen (ADVIL) 800 MG tablet Take 800 mg by mouth 3 (three) times daily as needed.    Marland Kitchen losartan-hydrochlorothiazide (HYZAAR) 50-12.5 MG tablet Take 1 tablet by mouth daily.     No current facility-administered medications for this encounter.    REVIEW OF SYSTEMS: As above   PHYSICAL EXAM:  vitals were not taken for this visit.   General: Alert and oriented, in no acute distress HEENT: Head is normocephalic.   Heart: Regular in rate and rhythm with no murmurs, rubs, or gallops. Chest: Clear to auscultation bilaterally, with no rhonchi, wheezes, or rales. Musculoskeletal: Ambulatory and able to get on the exam table without difficulty  neurologic:  No obvious focalities. Speech is fluent.  Psychiatric: Judgment and insight are intact. Affect is appropriate. Breasts: Postbiopsy bruising in the lower inner quadrant, left breast. No other palpable masses appreciated in the breasts or axillae bilaterally.    ECOG = 0  0 - Asymptomatic (Fully active, able to carry on all predisease activities without restriction)  1 - Symptomatic but completely ambulatory (Restricted in physically strenuous activity but ambulatory and able to carry out work of a light or sedentary nature. For example, light housework, office work)  2 - Symptomatic, <50% in bed during the day (  Ambulatory and capable of all self care but unable to carry out any work activities. Up and about more than 50% of waking hours)  3 - Symptomatic, >50% in bed, but not bedbound (Capable of only limited self-care, confined to bed or chair 50% or more of waking hours)  4 - Bedbound (Completely disabled. Cannot carry on any self-care. Totally  confined to bed or chair)  5 - Death   Eustace Pen MM, Creech RH, Tormey DC, et al. [phone]). "Toxicity and response criteria of the [facility]". [place] Oncol. 5 (6): 649-55   LABORATORY DATA:  Lab Results  Component Value Date   WBC 6.9 05/22/2021   HGB 12.8 05/22/2021   HCT 37.8 05/22/2021   MCV 91.3 05/22/2021   PLT 483 (H) 05/22/2021   CMP     Component Value Date/Time   NA 139 05/22/2021 0836   K 4.0 05/22/2021 0836   CL 104 05/22/2021 0836   CO2 24 05/22/2021 0836   GLUCOSE 140 (H) 05/22/2021 0836   BUN 12 05/22/2021 0836   CREATININE 0.79 05/22/2021 0836   CALCIUM 9.6 05/22/2021 0836   PROT 7.6 05/22/2021 0836   ALBUMIN 3.9 05/22/2021 0836   AST 17 05/22/2021 0836   ALT 13 05/22/2021 0836   ALKPHOS 64 05/22/2021 0836   BILITOT 0.7 05/22/2021 0836   GFRNONAA >60 05/22/2021 0836         RADIOGRAPHY: MR BREAST BILATERAL W WO CONTRAST INC CAD  Result Date: 05/20/2021 CLINICAL DATA:  50 year old female with 2 areas of biopsy proven high-grade ductal carcinoma in-situ with calcifications and necrosis in the medial aspect of the left breast. Patient developed a moderate size hematoma following the biopsies. Two additional stereotactic biopsies of the right breast are scheduled on 05/23/2021. LABS:  None obtained at the time of imaging. EXAM: BILATERAL BREAST MRI WITH AND WITHOUT CONTRAST TECHNIQUE: Multiplanar, multisequence MR images of both breasts were obtained prior to and following the intravenous administration of 10 ml of Gadavist Three-dimensional MR images were rendered by post-processing of the original MR data on an independent workstation. The three-dimensional MR images were interpreted, and findings are reported in the following complete MRI report for this study. Three dimensional images were evaluated at the independent interpreting workstation using the DynaCAD thin client. COMPARISON:  Previous exam(s). FINDINGS: Breast composition: b.  Scattered fibroglandular tissue. Background parenchymal enhancement: Moderate. Right breast: No mass or abnormal enhancement. Left breast: Biopsy changes are seen in the anterior aspect of the left breast. There are 2 signal void artifacts anteriorly in the breast from the biopsies. There is asymmetric non masslike enhancement in the anterior aspect of the breast likely related to post biopsy change/hematoma. No additional abnormal enhancement is seen in the left breast. Lymph nodes: No abnormal appearing lymph nodes. Ancillary findings:  None. IMPRESSION: Non masslike enhancement in the anterior aspect of the left breast consistent with post biopsy change/hematoma. No additional abnormal enhancement in the left breast is identified. No abnormal enhancement in the right breast. RECOMMENDATION: Stereotactic biopsies of the right breast has been scheduled on 05/23/2021 for indeterminate calcifications. 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 BI-RADS CATEGORY  6: Known biopsy-proven malignancy. Electronically Signed   By: Lillia Mountain M.D.   On: 05/20/2021 12:39   MM DIAG BREAST TOMO BILATERAL  Result Date: 05/07/2021 CLINICAL DATA:  Screening recall for bilateral breast calcifications. EXAM: DIGITAL DIAGNOSTIC BILATERAL MAMMOGRAM WITH TOMOSYNTHESIS AND CAD  TECHNIQUE: Bilateral digital diagnostic mammography and breast tomosynthesis was performed. The images were evaluated with computer-aided detection. COMPARISON:  Previous exam(s). ACR Breast Density Category b: There are scattered areas of fibroglandular density. FINDINGS: In the lower-inner quadrant of the left breast, anterior depth, there is a 5 cm group of segmental amorphous and coarse calcifications. In the upper outer posterior right breast, spot compression magnification images reveal 2 adjacent groups of amorphous calcifications measuring 0.6 and 0.5 cm respectively. IMPRESSION: 1. There is a  5 cm group of suspicious calcifications in the lower-inner quadrant of the left breast. 2. There are 2 small adjacent groups of calcifications in the upper-outer quadrant of the right breast, which may represent fibrocystic changes. RECOMMENDATION: Stereotactic biopsy is recommended for the anterior and posterior margin of calcifications in the lower-inner left breast. If the biopsies are benign, than six-month follow-up right breast diagnostic mammogram is recommended to monitor the other 2 groups. If the biopsy is abnormal, than a double stereotactic biopsy of the right breast is recommended. The left breast biopsies have been scheduled for 05/15/2021 at 9:30 a.m. I have discussed the findings and recommendations with the patient. If applicable, a reminder letter will be sent to the patient regarding the next appointment. BI-RADS CATEGORY  4: Suspicious. Electronically Signed   By: Ammie Ferrier M.D.   On: 05/07/2021 15:00   MM CLIP PLACEMENT LEFT  Result Date: 05/15/2021 CLINICAL DATA:  Status post stereotactic biopsies of the left breast. EXAM: DIAGNOSTIC LEFT MAMMOGRAM POST STEREOTACTIC BIOPSIES COMPARISON:  Previous exam(s). FINDINGS: Mammographic images were obtained following stereotactic guided biopsies of the left breast. There is a ribbon shaped clip in the lower-inner quadrant of the left breast located approximately 1.5 cm lateral from the biopsied calcifications. There is a X shaped clip in appropriate position in the lower-inner quadrant of the left breast. The clips are located approximately 2.7 cm part. IMPRESSION: The ribbon shaped clip is located approximately 1.5 cm lateral to the biopsied calcifications. The X shaped clip is felt to be in good position. Final Assessment: Post Procedure Mammograms for Marker Placement Electronically Signed   By: Lillia Mountain M.D.   On: 05/15/2021 11:06   MM LT BREAST BX W LOC DEV 1ST LESION IMAGE BX SPEC STEREO GUIDE  Addendum Date: 05/20/2021   ADDENDUM  REPORT: 05/17/2021 08:18 ADDENDUM: Pathology revealed HIGH GRADE DUCTAL CARCINOMA IN SITU WITH CALCIFICATIONS AND NECROSIS of the LEFT breast, lower inner quadrant, anterior, ribbon clip. This was found to be concordant by Dr. Lillia Mountain. Pathology revealed HIGH GRADE DUCTAL CARCINOMA IN SITU WITH CALCIFICATIONS AND NECROSIS of the LEFT breast, lower inner quadrant, posterior, X clip. This was found to be concordant by Dr. Lillia Mountain. Pathology results were discussed with the patient by telephone with Kathrine Haddock- Bilingual Patient Services Representative. The patient reported doing well after the biopsies with tenderness at the sites. Post biopsy instructions and care were reviewed and questions were answered. The patient was encouraged to call [facility] for any additional concerns. The patient was referred to [facility] on May 22, 2021. The patient is scheduled 05/23/2021 for additional stereo biopsies of RIGHT breast. Consider MRI given high grade histology. Pathology results reported by Stacie Acres RN on 05/17/2021. Electronically Signed   By: Lillia Mountain M.D.   On: 05/17/2021 08:18   Result Date: 05/20/2021 CLINICAL DATA:  Suspicious left breast calcifications. Biopsy of the anterior and  posterior extent of the calcifications recommended. EXAM: LEFT BREAST STEREOTACTIC CORE NEEDLE BIOPSIES COMPARISON:  Previous exams. FINDINGS: The patient and I discussed the procedure of stereotactic-guided biopsy including benefits and alternatives. We discussed the high likelihood of a successful procedure. We discussed the risks of the procedure including infection, bleeding, tissue injury, clip migration, and inadequate sampling. Informed written consent was given. The usual time out protocol was performed immediately prior to the procedure. Using sterile technique and 1% Lidocaine as local anesthetic, under  stereotactic guidance, a 9 gauge vacuum assisted device was used to perform core needle biopsy of calcifications in the lower-inner quadrant of the left breast (anterior) using a medial to lateral approach. Specimen radiograph was performed showing calcifications are in the tissue samples. Specimens with calcifications are identified for pathology. Lesion quadrant: Lower inner quadrant (anterior) At the conclusion of the procedure, ribbon shaped tissue marker clip was deployed into the biopsy cavity. Follow-up 2-view mammogram was performed and dictated separately. The patient and I discussed the procedure of stereotactic-guided biopsy including benefits and alternatives. We discussed the high likelihood of a successful procedure. We discussed the risks of the procedure including infection, bleeding, tissue injury, clip migration, and inadequate sampling. Informed written consent was given. The usual time out protocol was performed immediately prior to the procedure. Using sterile technique and 1% Lidocaine as local anesthetic, under stereotactic guidance, a 9 gauge vacuum assisted device was used to perform core needle biopsy of calcifications in the lower-inner quadrant of the left breast (posterior) using a medial to lateral approach. Specimen radiograph was performed showing calcifications are in the tissue samples. Specimens with calcifications are identified for pathology. Lesion quadrant: Lower inner quadrant (posterior) At the conclusion of the procedure, X shaped tissue marker clip was deployed into the biopsy cavity. Follow-up 2-view mammogram was performed and dictated separately. IMPRESSION: Stereotactic-guided biopsies of calcifications in the left breast. No apparent complications. Electronically Signed: By: Lillia Mountain M.D. On: 05/15/2021 10:41   MM LT BREAST BX W LOC DEV EA AD LESION IMG BX SPEC STEREO GUIDE  Addendum Date: 05/20/2021   ADDENDUM REPORT: 05/17/2021 08:18 ADDENDUM: Pathology  revealed HIGH GRADE DUCTAL CARCINOMA IN SITU WITH CALCIFICATIONS AND NECROSIS of the LEFT breast, lower inner quadrant, anterior, ribbon clip. This was found to be concordant by Dr. Lillia Mountain. Pathology revealed HIGH GRADE DUCTAL CARCINOMA IN SITU WITH CALCIFICATIONS AND NECROSIS of the LEFT breast, lower inner quadrant, posterior, X clip. This was found to be concordant by Dr. Lillia Mountain. Pathology results were discussed with the patient by telephone with Kathrine Haddock- Bilingual Patient Services Representative. The patient reported doing well after the biopsies with tenderness at the sites. Post biopsy instructions and care were reviewed and questions were answered. The patient was encouraged to call [facility] for any additional concerns. The patient was referred to [facility] on May 22, 2021. The patient is scheduled 05/23/2021 for additional stereo biopsies of RIGHT breast. Consider MRI given high grade histology. Pathology results reported by Stacie Acres RN on 05/17/2021. Electronically Signed   By: Lillia Mountain M.D.   On: 05/17/2021 08:18   Result Date: 05/20/2021 CLINICAL DATA:  Suspicious left breast calcifications. Biopsy of the anterior and posterior extent of the calcifications recommended. EXAM: LEFT BREAST STEREOTACTIC CORE NEEDLE BIOPSIES COMPARISON:  Previous exams. FINDINGS: The patient and I discussed the procedure of stereotactic-guided biopsy including benefits and alternatives. We discussed the  high likelihood of a successful procedure. We discussed the risks of the procedure including infection, bleeding, tissue injury, clip migration, and inadequate sampling. Informed written consent was given. The usual time out protocol was performed immediately prior to the procedure. Using sterile technique and 1% Lidocaine as local anesthetic, under stereotactic guidance, a 9 gauge vacuum assisted  device was used to perform core needle biopsy of calcifications in the lower-inner quadrant of the left breast (anterior) using a medial to lateral approach. Specimen radiograph was performed showing calcifications are in the tissue samples. Specimens with calcifications are identified for pathology. Lesion quadrant: Lower inner quadrant (anterior) At the conclusion of the procedure, ribbon shaped tissue marker clip was deployed into the biopsy cavity. Follow-up 2-view mammogram was performed and dictated separately. The patient and I discussed the procedure of stereotactic-guided biopsy including benefits and alternatives. We discussed the high likelihood of a successful procedure. We discussed the risks of the procedure including infection, bleeding, tissue injury, clip migration, and inadequate sampling. Informed written consent was given. The usual time out protocol was performed immediately prior to the procedure. Using sterile technique and 1% Lidocaine as local anesthetic, under stereotactic guidance, a 9 gauge vacuum assisted device was used to perform core needle biopsy of calcifications in the lower-inner quadrant of the left breast (posterior) using a medial to lateral approach. Specimen radiograph was performed showing calcifications are in the tissue samples. Specimens with calcifications are identified for pathology. Lesion quadrant: Lower inner quadrant (posterior) At the conclusion of the procedure, X shaped tissue marker clip was deployed into the biopsy cavity. Follow-up 2-view mammogram was performed and dictated separately. IMPRESSION: Stereotactic-guided biopsies of calcifications in the left breast. No apparent complications. Electronically Signed: By: Lillia Mountain M.D. On: 05/15/2021 10:41      IMPRESSION/PLAN: This is a very pleasant 50 year old woman with left breast high-grade DCIS, ER 30% positive, weak, PR negative.  Bx of Right breast calcifications is pending  Genetic testing is  pending.  If she does not reveal a concerning mutation she is a candidate for breast conserving surgery.  Today we spoke about postoperative radiation therapy with the assumption that she is likely to undergo breast conserving surgery.  It was a pleasure meeting the patient today. We discussed the risks, benefits, and side effects of radiotherapy.  If she undergoes lumpectomy (or bilateral lumpectomies)  I recommend radiotherapy to the left breast (and possibly right breast, if involved by carcinoma)  to reduce her risk of locoregional recurrence by at least half.  We discussed that radiation would take approximately 4 weeks to complete and that I would give the patient a few weeks to heal following surgery before starting treatment planning.  We spoke about acute effects including skin irritation and fatigue as well as much less common late effects including internal organ injury or irritation. We spoke about the latest technology that is used to minimize the risk of late effects for patients undergoing radiotherapy to the breast or chest wall. No guarantees of treatment were given. The patient is enthusiastic about proceeding with treatment. I look forward to participating in the patient's care.  I will await her referral back to me for postoperative follow-up and eventual CT simulation/treatment planning.   We discussed measures to reduce the risk of infection during the COVID-19 pandemic. She reports she is fully vaccinated.    On date of service, in total, I spent 45 minutes on this encounter. Patient was seen in person.   __________________________________________  Eppie Gibson, MD

## 2021-05-22 NOTE — Progress Notes (Signed)
[place] Work  Initial Assessment   Destiny Little is a 50 y.o. year old female seen with Spanish interpreter, Almyra Free. Clinical Social Work was referred by [facility] for assessment of psychosocial needs.   SDOH (Social Determinants of Health) assessments performed: Yes SDOH Interventions   Flowsheet Row Most Recent Value  SDOH Interventions   Food Insecurity Interventions Intervention Not Indicated  Transportation Interventions Other (Comment)  [Might need help depending on appts & daughter's schedule]      Distress Screen completed: Yes ONCBCN DISTRESS SCREENING 05/22/2021  Screening Type Initial Screening  Distress experienced in past week (1-10) 0  Practical problem type Work/school;Transportation  Emotional problem type Nervousness/Anxiety;Adjusting to illness;Isolation/feeling alone  Physical Problem type Tingling hands/feet;Skin dry/itchy    Family/Social Information:  . Housing Arrangement: patient lives with daughter Mitchel Honour) and 23yo granddaughter. 2 other adult children in the [place] . Family members/support persons in your life? Family . Transportation concerns: possibly depending on appt times and daughter's schedule. Patient wanted to hold off on referral to transportation services until she sees her schedule  . 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 Income source: Employment . Financial concerns: Not currently o Type of concern: None . Food access concerns: no . Religious or spiritual practice: not addressed . Services Currently in place:  n/a  Coping/ Adjustment to diagnosis: . Patient understands treatment plan and what happens next? yes . Concerns about diagnosis and/or treatment: General adjustment. Possible transportation needs . Patient reported stressors: Transportation, Adjusting to my illness and Isolation/ feeling alone . Patient enjoys time with family/ friends . Current coping skills/ strengths: Motivation for  treatment/growth and Supportive family/friends    SUMMARY: Current SDOH Barriers:  . Transportation (possible needs)  Clinical Social Work Clinical Goal(s):  Marland Kitchen Patient will contact social work team as soon as she determines if she will need transportation assistance (pt declined referral at this time)  Interventions: . Discussed common feeling and emotions when being diagnosed with cancer, and the importance of support during treatment . Informed patient of the support team roles and support services at [facility] . Provided CSW contact information and encouraged patient to call with any questions or concerns   Follow Up Plan: Patient will contact CSW with any support or resource needs Patient verbalizes understanding of plan: Yes    Christeen Douglas , LCSW

## 2021-05-23 ENCOUNTER — Other Ambulatory Visit: Payer: Self-pay | Admitting: Internal Medicine

## 2021-05-23 ENCOUNTER — Ambulatory Visit
Admission: RE | Admit: 2021-05-23 | Discharge: 2021-05-23 | Disposition: A | Discharge status: Home / Self Care | Payer: 59 | Source: Ambulatory Visit | Attending: Internal Medicine | Admitting: Internal Medicine

## 2021-05-23 DIAGNOSIS — C50912 Malignant neoplasm of unspecified site of left female breast: Secondary | ICD-10-CM

## 2021-05-28 ENCOUNTER — Encounter: Payer: Self-pay | Admitting: Genetic Counselor

## 2021-05-28 ENCOUNTER — Telehealth: Payer: Self-pay | Admitting: Genetic Counselor

## 2021-05-28 DIAGNOSIS — Z1379 Encounter for other screening for genetic and chromosomal anomalies: Secondary | ICD-10-CM | POA: Insufficient documentation

## 2021-05-28 NOTE — Telephone Encounter (Signed)
Revealed negative genetic testing of [place] BRCAPlus Panel.  Discussed that we do not know why she has breast cancer. It could be sporadic, due to a different gene that we are not testing, or maybe our current technology may not be able to pick something up.  It will be important for her to keep in contact with genetics to keep up with whether additional testing may be needed.  Results of pan-cancer panel pending.

## 2021-05-29 ENCOUNTER — Encounter: Payer: Self-pay | Admitting: *Deleted

## 2021-05-29 DIAGNOSIS — C50919 Malignant neoplasm of unspecified site of unspecified female breast: Secondary | ICD-10-CM | POA: Insufficient documentation

## 2021-05-29 DIAGNOSIS — R87613 High grade squamous intraepithelial lesion on cytologic smear of cervix (HGSIL): Secondary | ICD-10-CM | POA: Insufficient documentation

## 2021-05-30 ENCOUNTER — Other Ambulatory Visit: Payer: Self-pay | Admitting: General Surgery

## 2021-05-30 ENCOUNTER — Encounter: Payer: Self-pay | Admitting: *Deleted

## 2021-05-30 DIAGNOSIS — C50512 Malignant neoplasm of lower-outer quadrant of left female breast: Secondary | ICD-10-CM

## 2021-05-30 DIAGNOSIS — D0512 Intraductal carcinoma in situ of left breast: Secondary | ICD-10-CM

## 2021-05-31 ENCOUNTER — Encounter: Payer: Self-pay | Admitting: *Deleted

## 2021-06-04 ENCOUNTER — Telehealth: Payer: Self-pay | Admitting: Genetic Counselor

## 2021-06-04 NOTE — Telephone Encounter (Signed)
Contacted patient in attempt to disclose results of pan-cancer genetic testing.  LVM with contact information requesting a call back with assistance of Spanish interpreter [place] ID: [phone].

## 2021-06-05 NOTE — Pre-Procedure Instructions (Signed)
Surgical Instructions    Your procedure is scheduled on Wednesday June 29th.  Report to [facility] Main Entrance "A" at 11:30 A.M., then check in with the Admitting office.  Call this number if you have problems the morning of surgery:  [phone]   If you have any questions prior to your surgery date call [phone]: Open Monday-Friday 8am-4pm    Remember:  Do not eat after midnight the night before your surgery  You may drink clear liquids until 10:30 a.m. the morning of your surgery.   Clear liquids allowed are: Water, Non-Citrus Juices (without pulp), Carbonated Beverages, Clear Tea, Black Coffee Only, and Gatorade    Take these medicines the morning of surgery with A SIP OF WATER   NONE    As of today, STOP taking any Aspirin (unless otherwise instructed by your surgeon) Aleve, Naproxen, Ibuprofen, Motrin, Advil, Goody's, BC's, all herbal medications, fish oil, and all vitamins.          Do not wear jewelry or makeup Do not wear lotions, powders, perfumes/colognes, or deodorant. Do not shave 48 hours prior to surgery.  Do not bring valuables to the hospital. DO Not wear nail polish, gel polish, artificial nails, or any other type of covering on natural nails including finger and toenails. If patients have artificial nails, gel coating, etc. that need to be removed by a nail salon please have this removed prior to surgery or surgery may need to be canceled/delayed if the surgeon/ anesthesia feels like the patient is unable to be adequately monitored.             [facility] is not responsible for any belongings or valuables.  Do NOT Smoke (Tobacco/Vaping) or drink Alcohol 24 hours prior to your procedure If you use a CPAP at night, you may bring all equipment for your overnight stay.   Contacts, glasses, dentures or bridgework may not be worn into surgery, please bring cases for these belongings   For patients admitted to the hospital, discharge time will be determined  by your treatment team.   Patients discharged the day of surgery will not be allowed to drive home, and someone needs to stay with them for 24 hours.  ONLY 1 SUPPORT PERSON MAY BE PRESENT WHILE YOU ARE IN SURGERY. IF YOU ARE TO BE ADMITTED ONCE YOU ARE IN YOUR ROOM YOU WILL BE ALLOWED TWO (2) VISITORS.  Minor children may have two parents present. Special consideration for safety and communication needs will be reviewed on a case by case basis.  Special instructions:    Oral Hygiene is also important to reduce your risk of infection.  Remember - BRUSH YOUR TEETH THE MORNING OF SURGERY WITH YOUR REGULAR TOOTHPASTE   [facility]- Preparing For Surgery  Before surgery, you can play an important role. Because skin is not sterile, your skin needs to be as free of germs as possible. You can reduce the number of germs on your skin by washing with CHG (chlorahexidine gluconate) Soap before surgery.  CHG is an antiseptic cleaner which kills germs and bonds with the skin to continue killing germs even after washing.     Please do not use if you have an allergy to CHG or antibacterial soaps. If your skin becomes reddened/irritated stop using the CHG.  Do not shave (including legs and underarms) for at least 48 hours prior to first CHG shower. It is OK to shave your face.  Please follow these instructions carefully.  Shower the NIGHT BEFORE SURGERY and the MORNING OF SURGERY with CHG Soap.   If you chose to wash your hair, wash your hair first as usual with your normal shampoo. After you shampoo, rinse your hair and body thoroughly to remove the shampoo.  Then [employer] and genitals (private parts) with your normal soap and rinse thoroughly to remove soap.  After that Use CHG Soap as you would any other liquid soap. You can apply CHG directly to the skin and wash gently with a scrungie or a clean washcloth.   Apply the CHG Soap to your body ONLY FROM THE NECK DOWN.  Do not use on open wounds or  open sores. Avoid contact with your eyes, ears, mouth and genitals (private parts). Wash Face and genitals (private parts)  with your normal soap.   Wash thoroughly, paying special attention to the area where your surgery will be performed.  Thoroughly rinse your body with warm water from the neck down.  DO NOT shower/wash with your normal soap after using and rinsing off the CHG Soap.  Pat yourself dry with a CLEAN TOWEL.  Wear CLEAN PAJAMAS to bed the night before surgery  Place CLEAN SHEETS on your bed the night before your surgery  DO NOT SLEEP WITH PETS.   Day of Surgery:  Take a shower with CHG soap. Wear Clean/Comfortable clothing the morning of surgery Do not apply any deodorants/lotions.   Remember to brush your teeth WITH YOUR REGULAR TOOTHPASTE.   Please read over the following fact sheets that you were given.

## 2021-06-06 ENCOUNTER — Encounter (HOSPITAL_COMMUNITY)
Admission: RE | Admit: 2021-06-06 | Discharge: 2021-06-06 | Disposition: A | Discharge status: Home / Self Care | Payer: 59 | Source: Ambulatory Visit | Attending: General Surgery | Admitting: General Surgery

## 2021-06-06 ENCOUNTER — Encounter (HOSPITAL_COMMUNITY): Payer: Self-pay

## 2021-06-06 ENCOUNTER — Other Ambulatory Visit: Payer: Self-pay

## 2021-06-06 DIAGNOSIS — Z01818 Encounter for other preprocedural examination: Secondary | ICD-10-CM | POA: Diagnosis present

## 2021-06-06 DIAGNOSIS — B009 Herpesviral infection, unspecified: Secondary | ICD-10-CM | POA: Insufficient documentation

## 2021-06-06 DIAGNOSIS — Z8742 Personal history of other diseases of the female genital tract: Secondary | ICD-10-CM | POA: Insufficient documentation

## 2021-06-06 DIAGNOSIS — K219 Gastro-esophageal reflux disease without esophagitis: Secondary | ICD-10-CM | POA: Insufficient documentation

## 2021-06-06 DIAGNOSIS — E785 Hyperlipidemia, unspecified: Secondary | ICD-10-CM | POA: Insufficient documentation

## 2021-06-06 HISTORY — DX: Personal history of urinary calculi: Z87.442

## 2021-06-06 HISTORY — DX: Anxiety disorder, unspecified: F41.9

## 2021-06-06 HISTORY — DX: Prediabetes: R73.03

## 2021-06-06 LAB — COMPREHENSIVE METABOLIC PANEL
ALT: 15 U/L (ref 0–44)
AST: 19 U/L (ref 15–41)
Albumin: 3.8 g/dL (ref 3.5–5.0)
Alkaline Phosphatase: 55 U/L (ref 38–126)
Anion gap: 8 (ref 5–15)
BUN: 13 mg/dL (ref 6–20)
CO2: 26 mmol/L (ref 22–32)
Calcium: 9.5 mg/dL (ref 8.9–10.3)
Chloride: 103 mmol/L (ref 98–111)
Creatinine, Ser: 0.71 mg/dL (ref 0.44–1.00)
GFR, Estimated: >60 mL/min (ref >60)
Glucose, Bld: 107 mg/dL — ABNORMAL HIGH (ref 70–99)
Potassium: 4 mmol/L (ref 3.5–5.1)
Sodium: 137 mmol/L (ref 135–145)
Total Bilirubin: 0.7 mg/dL (ref 0.3–1.2)
Total Protein: 7.1 g/dL (ref 6.5–8.1)

## 2021-06-06 LAB — CBC WITH DIFFERENTIAL/PLATELET
Abs Immature Granulocytes: 0.02 10*3/uL (ref 0.00–0.07)
Basophils Absolute: 0 10*3/uL (ref 0.0–0.1)
Basophils Relative: 0 %
Eosinophils Absolute: 0.1 10*3/uL (ref 0.0–0.5)
Eosinophils Relative: 1 %
HCT: 40.5 % (ref 36.0–46.0)
Hemoglobin: 12.9 g/dL (ref 12.0–15.0)
Immature Granulocytes: 0 %
Lymphocytes Relative: 39 %
Lymphs Abs: 3.1 10*3/uL (ref 0.7–4.0)
MCH: 30.8 pg (ref 26.0–34.0)
MCHC: 31.9 g/dL (ref 30.0–36.0)
MCV: 96.7 fL (ref 80.0–100.0)
Monocytes Absolute: 0.4 10*3/uL (ref 0.1–1.0)
Monocytes Relative: 6 %
Neutro Abs: 4.3 10*3/uL (ref 1.7–7.7)
Neutrophils Relative %: 54 %
Platelets: 466 10*3/uL — ABNORMAL HIGH (ref 150–400)
RBC: 4.19 MIL/uL (ref 3.87–5.11)
RDW: 13.1 % (ref 11.5–15.5)
WBC: 8 10*3/uL (ref 4.0–10.5)
nRBC: 0 % (ref 0.0–0.2)

## 2021-06-06 LAB — GLUCOSE, CAPILLARY: Glucose-Capillary: 106 mg/dL — ABNORMAL HIGH (ref 70–99)

## 2021-06-06 NOTE — Progress Notes (Signed)
PCP - Raelyn Number at [place] Cardiologist - denies  PPM/ICD - n/a Device Orders - n/a Rep Notified - n/a  Chest x-ray - denies EKG - 06/06/21 Stress Test - denies ECHO - denies Cardiac Cath - denies  Sleep Study - 2019 at [employer]  CPAP - Was prescribed CPAP in 2019 prior to gastric sleeve surgery but has not used CPAP since the surgery  Fasting Blood Sugar - 106 Pre-diabetes. Does not check blood sugar. Per patient- diet controlled  Blood Thinner Instructions: n/a Aspirin Instructions: n/a  ERAS Protcol - Clear liquids until 3 hours prior to surgery PRE-SURGERY Ensure or G2-   COVID TEST- n/a. Ambulatory surgery.   Anesthesia review: Yes. EKG Review. Seed bracketed lumpectomy.   Patient denies shortness of breath, fever, cough and chest pain at PAT appointment   All instructions explained to the patient, with a verbal understanding of the material. Patient agrees to go over the instructions while at home for a better understanding. Patient also instructed to self quarantine after being tested for COVID-19. The opportunity to ask questions was provided.

## 2021-06-07 NOTE — Anesthesia Preprocedure Evaluation (Addendum)
Anesthesia Evaluation  Patient identified by MRN, date of birth, ID band Patient awake    Reviewed: Allergy & Precautions, NPO status , Patient's Chart, lab work & pertinent test results  Airway Mallampati: II  TM Distance: >3 FB Neck ROM: Full    Dental no notable dental hx.    Pulmonary neg pulmonary ROS,    Pulmonary exam normal breath sounds clear to auscultation       Cardiovascular hypertension, Pt. on medications Normal cardiovascular exam Rhythm:Regular Rate:Normal     Neuro/Psych  Headaches, Anxiety    GI/Hepatic negative GI ROS, Neg liver ROS,   Endo/Other  Morbid obesity  Renal/GU negative Renal ROS     Musculoskeletal S/p hysterectomy   Abdominal   Peds  Hematology negative hematology ROS (+)   Anesthesia Other Findings LEFT BREAST CANCER  Reproductive/Obstetrics                            Anesthesia Physical Anesthesia Plan  ASA: 3  Anesthesia Plan: General   Post-op Pain Management:    Induction: Intravenous  PONV Risk Score and Plan: 3 and Ondansetron, Dexamethasone, Midazolam and Treatment may vary due to age or medical condition  Airway Management Planned: LMA  Additional Equipment:   Intra-op Plan:   Post-operative Plan: Extubation in OR  Informed Consent: I have reviewed the patients History and Physical, chart, labs and discussed the procedure including the risks, benefits and alternatives for the proposed anesthesia with the patient or authorized representative who has indicated his/her understanding and acceptance.     Dental advisory given  Plan Discussed with: CRNA  Anesthesia Plan Comments: (Reviewed PAT note written 06/07/2021 by Myra Gianotti, PA-C. )       Anesthesia Quick Evaluation

## 2021-06-07 NOTE — Progress Notes (Signed)
Anesthesia Chart Review:  Case: [id number] Date/Time: 06/12/21 1315   Procedure: LEFT BREAST LUMPECTOMY WITH RADIOACTIVE SEED LOCALIZATION X 2 (Left: Breast) - 60 MINUTES ROOM 3   Anesthesia type: General   Pre-op diagnosis: LEFT BREAST CANCER   Location: [facility] OR ROOM 10 / [place] OR   Surgeons: Stark Klein, MD       DISCUSSION: Patient is a 50 year old female scheduled for the above procedure.  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 BMI is consistent with morbid obesity (BMI 42.82, down from 48.14 prior to bariatric surgery).   RSL scheduled for 06/11/21 at 10:00 AM. Anesthesia team to evaluate on the day of surgery.   VS: BP 112/63   Pulse 67   Temp 37.2 C (Oral)   Resp 18   Ht 5\' 3"  (1.6 m)   Wt 109.6 kg   SpO2 100%   BMI 42.82 kg/m  Per [facility] 05/22/21 note, "She had hysterectomy due to heavy bleeding and enlarged uterus".   PROVIDERS: Trey Sailors, PA is PCP  Truitt Merle, MD is [facility] Dionne Milo, MD is pulmonologist (for OSA). Saw in 2019 prior to bariatric surgery. Last visit 03/11/18 following HST showing AHI 14 with plan for auto CPAP at 5-15 cm.  (She reported that she is not using CPAP since weight loss following surgery. Wt 263 lb, 119.4 kg, BMI 48.14 04/13/18 prior to surgery.)   LABS: Labs reviewed: Acceptable for surgery. (all labs ordered are listed, but only abnormal results are displayed)  Labs Reviewed  GLUCOSE, CAPILLARY - Abnormal; Notable for the following components:      Result Value   Glucose-Capillary 106 (*)    All other components within normal limits  CBC WITH DIFFERENTIAL/PLATELET - Abnormal; Notable for the  following components:   Platelets 466 (*)    All other components within normal limits  COMPREHENSIVE METABOLIC PANEL - Abnormal; Notable for the following components:   Glucose, Bld 107 (*)    All other components within normal limits     EKG: 06/06/21: Normal sinus rhythm with sinus arrhythmia Cannot rule out Anterior infarct , age undetermined Abnormal ECG No previous tracing Confirmed by Fransico Him ([id number]) on 06/06/2021 2:30:10 PM   CV: N/A  Past Medical History:  Diagnosis Date   Anxiety    Breast cancer ([place])    History of kidney stones    Hypertension    Migraines    Pre-diabetes     Past Surgical History:  Procedure Laterality Date   ABDOMINAL HYSTERECTOMY     CESAREAN SECTION     x3   gastic bypass      MEDICATIONS:  ibuprofen (ADVIL) 200 MG tablet   losartan-hydrochlorothiazide (HYZAAR) 50-12.5 MG tablet   No current facility-administered medications for this encounter.    Myra Gianotti, PA-C Surgical Short Stay/Anesthesiology [facility] Phone [phone] [facility] Phone [phone] 06/07/2021 2:55 PM

## 2021-06-11 ENCOUNTER — Ambulatory Visit
Admission: RE | Admit: 2021-06-11 | Discharge: 2021-06-11 | Disposition: A | Discharge status: Home / Self Care | Payer: 59 | Source: Ambulatory Visit | Attending: General Surgery | Admitting: General Surgery

## 2021-06-11 ENCOUNTER — Other Ambulatory Visit: Payer: Self-pay

## 2021-06-11 DIAGNOSIS — C50512 Malignant neoplasm of lower-outer quadrant of left female breast: Secondary | ICD-10-CM

## 2021-06-11 DIAGNOSIS — Z170 Estrogen receptor positive status [ER+]: Secondary | ICD-10-CM

## 2021-06-11 NOTE — H&P (Signed)
Destiny Little Appointment: 05/22/2021 9:00 AM Location: [facility] Patient #: [id number] DOB: 11-10-1971 [name] / Language: [name] / Race: Refused to Report/Unreported Female   History of Present Illness Destiny Klein MD; 05/22/2021 11:50 AM) The patient is a 50 year old female who presents with breast cancer. Pt is a 50 yo F referred by Dr. Miquel Little for a new diagnosis of left breast cancer and right breast calcifications 05/2021.  She had screening detected calcifications bilaterally.  Diagnostic imaging showed 5 cm of calcifications on the left in the LIQ.  The right side had two areas of calcifications in the upper outer quadrant, one 6 mm and one 5 mm.  Breast density is B.  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 The patient then had an MRI that didn't show any additional findings (and no enhancement was seen at the right sided calcifications).  The right sided biopsies are planned for tomorrow.    She works in Actuary at [employer].  She had menarche at age 43.  She is s/p hysterectomy.  She is a G3P3 with first child at age 46.  She hasn't had colonoscopy or bone density study yet.  She has no family history of cancer of which she is aware.  She has no prior personal cancer dx before this.  She does not smoke, use drugs or EtOH.    dx mammograms 05/07/21 EXAM: DIGITAL DIAGNOSTIC BILATERAL MAMMOGRAM WITH TOMOSYNTHESIS AND CAD  TECHNIQUE: Bilateral digital diagnostic mammography and breast tomosynthesis was performed. The images were evaluated with computer-aided detection.  COMPARISON:  Previous exam(s).  ACR Breast Density Category b: There are scattered areas of fibroglandular density.  FINDINGS: In the lower-inner quadrant of the left breast, anterior depth, there is a 5 cm group of segmental amorphous and coarse calcifications.  In the upper outer posterior right breast, spot compression magnification images reveal 2  adjacent groups of amorphous calcifications measuring 0.6 and 0.5 cm respectively.  IMPRESSION: 1. There is a 5 cm group of suspicious calcifications in the lower-inner quadrant of the left breast.  2. There are 2 small adjacent groups of calcifications in the upper-outer quadrant of the right breast, which may represent fibrocystic changes.  RECOMMENDATION: Stereotactic biopsy is recommended for the anterior and posterior margin of calcifications in the lower-inner left breast.  If the biopsies are benign, than six-month follow-up right breast diagnostic mammogram is recommended to monitor the other 2 groups.  If the biopsy is abnormal, than a double stereotactic biopsy of the right breast is recommended.  The left breast biopsies have been scheduled for 05/15/2021 at 9:30 a.m.  I have discussed the findings and recommendations with the patient. If applicable, a reminder letter will be sent to the patient regarding the next appointment.  BI-RADS CATEGORY  4: Suspicious.  MR bilateral breast 05/20/21 IMPRESSION: Non masslike enhancement in the anterior aspect of the left breast consistent with post biopsy change/hematoma. No additional abnormal enhancement in the left breast is identified. No abnormal enhancement in the right breast.  RECOMMENDATION: Stereotactic biopsies of the right breast has been scheduled on 05/23/2021 for indeterminate calcifications.  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  BI-RADS CATEGORY  6: Known biopsy-proven malignancy.   pathology core needle bx 05/15/21 1. Breast, left, needle core biopsy, left breast LIQ anterior - DUCTAL CARCINOMA IN SITU WITH CALCIFICATIONS AND NECROSIS - SEE COMMENT 2. Breast, left, needle  core biopsy, left breast LIQ posterior - DUCTAL CARCINOMA IN SITU WITH CALCIFICATIONS AND NECROSIS - SEE COMMENT Microscopic Comment 1. and 2. Based on the biopsy,  the ductal carcinoma in situ has a comedo pattern, high nuclear grade and measures 0.2 cm in greatest linear extent IMMUNOHISTOCHEMICAL AND MORPHOMETRIC ANALYSIS PERFORMED MANUALLY Estrogen Receptor: 30%, POSITIVE, WEAK STAINING INTENSITY Progesterone Receptor: 0%, NEGATIVE  Labs 05/22/21 are essentially normal other than glucose 140.     Past Surgical History Destiny Klein, MD; 05/21/2021 6:37 PM) Breast Biopsy   Right. Cesarean Section - Multiple   Gastric Bypass   Hysterectomy (not due to cancer) - Partial    Diagnostic Studies History Destiny Klein, MD; 05/21/2021 6:37 PM) Colonoscopy   never Mammogram   within last year Pap Smear   1-5 years ago  Social History Destiny Klein, MD; 05/21/2021 6:37 PM) Alcohol use   Occasional alcohol use. Caffeine use   Coffee. No drug use   Tobacco use   Never smoker.  Family History Destiny Klein, MD; 05/21/2021 6:37 PM) Arthritis   Family Members In General. Diabetes Mellitus   Father. Heart Disease   Father. Hypertension   Father. Kidney Disease   Father. Migraine Headache   Mother. Thyroid problems   Sister.  Pregnancy / Birth History Destiny Klein, MD; 05/21/2021 6:37 PM) Age at menarche   67 years. Gravida   6 Length (months) of breastfeeding   3-6 Maternal age   46-20 Para   3 Regular periods    Other Problems Destiny Klein, MD; 05/21/2021 6:37 PM) Back Pain   Hemorrhoids   High blood pressure   Migraine Headache      Review of Systems Destiny Klein MD; 05/21/2021 6:37 PM) General Not Present- Appetite Loss, Chills, Fatigue, Fever, Night Sweats, Weight Gain and Weight Loss. Skin Present- Dryness. Not Present- Change in Wart/Mole, Hives, Jaundice, New Lesions, Non-Healing Wounds, Rash and Ulcer. HEENT Present- Seasonal Allergies and Wears glasses/contact lenses. Not Present- Earache, Hearing Loss, Hoarseness, Nose Bleed, Oral Ulcers, Ringing in the Ears, Sinus Pain, Sore Throat, Visual Disturbances and Yellow Eyes. Respiratory  Present- Snoring. Not Present- Bloody sputum, Chronic Cough, Difficulty Breathing and Wheezing. Breast Present- Breast Mass. Not Present- Breast Pain, Nipple Discharge and Skin Changes. Cardiovascular Not Present- Chest Pain, Difficulty Breathing Lying Down, Leg Cramps, Palpitations, Rapid Heart Rate, Shortness of Breath and Swelling of Extremities. Gastrointestinal Present- Constipation and Hemorrhoids. Not Present- Abdominal Pain, Bloating, Bloody Stool, Change in Bowel Habits, Chronic diarrhea, Difficulty Swallowing, Excessive gas, Gets full quickly at meals, Indigestion, Nausea, Rectal Pain and Vomiting. Female Genitourinary Not Present- Frequency, Nocturia, Painful Urination, Pelvic Pain and Urgency. Musculoskeletal Present- Back Pain. Not Present- Joint Pain, Joint Stiffness, Muscle Pain, Muscle Weakness and Swelling of Extremities. Neurological Present- Headaches. Not Present- Decreased Memory, Fainting, Numbness, Seizures, Tingling, Tremor, Trouble walking and Weakness. Psychiatric Not Present- Anxiety, Bipolar, Change in Sleep Pattern, Depression, Fearful and Frequent crying. Endocrine Not Present- Cold Intolerance, Excessive Hunger, Hair Changes, Heat Intolerance, Hot flashes and New Diabetes. Hematology Not Present- Blood Thinners, Easy Bruising, Excessive bleeding, Gland problems, HIV and Persistent Infections.  Vitals Destiny Klein MD; 05/22/2021 11:56 AM) 05/22/2021 11:56 AM Weight: 183.9 lb   Temp.: 97.7 F    Pulse: 66 (Regular)    Resp.: 18 (Unlabored)    BP: 130/76(Sitting, Left Arm, Standard)       Physical Exam Destiny Klein MD; 05/22/2021 11:51 AM) General Mental Status - Alert. General Appearance - Consistent with stated age. Hydration - Well hydrated.  Voice - Normal.  Head and Neck Head - normocephalic, atraumatic with no lesions or palpable masses. Trachea - midline. Thyroid Gland Characteristics - normal size and consistency.  Eye Eyeball - Bilateral -  Extraocular movements intact. Sclera/Conjunctiva - Bilateral - No scleral icterus.  Chest and Lung Exam Chest and lung exam reveals  - quiet, even and easy respiratory effort with no use of accessory muscles and on auscultation, normal breath sounds, no adventitious sounds and normal vocal resonance. Inspection Chest Wall - Normal. Back - normal.  Breast Note:  breasts ptotic bilaterally. significant bruising lower inner left breast. no palpable masses either breast. no nipple retraction or discharge. No LAD.   Cardiovascular Cardiovascular examination reveals  - normal heart sounds, regular rate and rhythm with no murmurs and normal pedal pulses bilaterally.  Abdomen Inspection Inspection of the abdomen reveals - No Hernias. Palpation/Percussion Palpation and Percussion of the abdomen reveal - Soft, Non Tender, No Rebound tenderness, No Rigidity (guarding) and No hepatosplenomegaly. Auscultation Auscultation of the abdomen reveals - Bowel sounds normal.  Neurologic Neurologic evaluation reveals  - alert and oriented x 3 with no impairment of recent or remote memory. Mental Status - Normal.  Musculoskeletal Global Assessment  - Note:  no gross deformities.  Normal Exam - Left - Upper Extremity Strength Normal and Lower Extremity Strength Normal. Normal Exam - Right - Upper Extremity Strength Normal and Lower Extremity Strength Normal.  Lymphatic Head & Neck  General Head & Neck Lymphatics: Bilateral - Description - Normal. Axillary  General Axillary Region: Bilateral - Description - Normal. Tenderness - Non Tender. Femoral & Inguinal  Generalized Femoral & Inguinal Lymphatics: Bilateral - Description - No Generalized lymphadenopathy.    Assessment & Plan Destiny Klein MD; 05/22/2021 11:57 AM) MALIGNANT NEOPLASM OF LOWER-OUTER QUADRANT OF LEFT BREAST OF FEMALE, ESTROGEN RECEPTOR POSITIVE (C50.512) Impression: Pt with new diagnosis of left breast cancer stage 0.  Given  patient's larger breast size, she appears to be a candidate for seed bracketed lumpectomy. I discussed this with patient and sister via interpreter.  She is getting genetic testing due to her young age, as as long as this is negative, she does want to pursue breast conservation. This is a larger lumpectomy, and she may need right sided reduction for symmetry if right side isn't malignant. As long as no invasive cancer is seen, she will not need surgical assessment of lymph node status.  The surgical procedure was described to the patient. I discussed the incision type and location and that we would need radiology involved on with multiple seed markers.  The risks and benefits of the procedure were described to the patient and she wishes to proceed.  We discussed the risks bleeding, infection, damage to other structures, need for further procedures/surgeries. We discussed the risk of seroma. The patient was advised if the area in the breast in cancer, we may need to go back to surgery for additional tissue to obtain negative margins or for a lymph node biopsy. The patient was advised that these are the most common complications, but that others can occur as well. They were advised against taking aspirin or other anti-inflammatory agents/blood thinners the week before surgery.  I advised her that she will need at least a week off work for recovery given the requirements of her job.  60 min spent in evaluation, examination, counseling, and coordination of care. >50% spent in counseling. CALCIFICATION OF RIGHT BREAST ON MAMMOGRAPHY (R92.1) Impression: Highly suspicious for DCIS as these  look very similar to left sided calcifications. Will tentatively plan seed lumpectomies on these as well.    Signed electronically by Destiny Klein, MD (05/22/2021 11:58 AM)

## 2021-06-12 ENCOUNTER — Other Ambulatory Visit: Payer: Self-pay

## 2021-06-12 ENCOUNTER — Ambulatory Visit (HOSPITAL_COMMUNITY)
Admission: RE | Admit: 2021-06-12 | Discharge: 2021-06-12 | Disposition: A | Discharge status: Home / Self Care | Payer: 59 | Attending: General Surgery | Admitting: General Surgery

## 2021-06-12 ENCOUNTER — Encounter (HOSPITAL_COMMUNITY): Payer: Self-pay | Admitting: General Surgery

## 2021-06-12 ENCOUNTER — Ambulatory Visit
Admission: RE | Admit: 2021-06-12 | Discharge: 2021-06-12 | Disposition: A | Discharge status: Home / Self Care | Payer: 59 | Source: Ambulatory Visit | Attending: General Surgery | Admitting: General Surgery

## 2021-06-12 ENCOUNTER — Encounter (HOSPITAL_COMMUNITY)
Admission: RE | Disposition: A | Discharge status: Home / Self Care | Payer: Self-pay | Source: Home / Self Care | Attending: General Surgery

## 2021-06-12 ENCOUNTER — Ambulatory Visit (HOSPITAL_COMMUNITY): Payer: 59 | Admitting: Certified Registered"

## 2021-06-12 ENCOUNTER — Ambulatory Visit (HOSPITAL_COMMUNITY): Payer: 59 | Admitting: Vascular Surgery

## 2021-06-12 DIAGNOSIS — Z841 Family history of disorders of kidney and ureter: Secondary | ICD-10-CM | POA: Insufficient documentation

## 2021-06-12 DIAGNOSIS — C50912 Malignant neoplasm of unspecified site of left female breast: Secondary | ICD-10-CM | POA: Diagnosis present

## 2021-06-12 DIAGNOSIS — Z833 Family history of diabetes mellitus: Secondary | ICD-10-CM | POA: Insufficient documentation

## 2021-06-12 DIAGNOSIS — Z6841 Body Mass Index (BMI) 40.0 and over, adult: Secondary | ICD-10-CM | POA: Insufficient documentation

## 2021-06-12 DIAGNOSIS — C50512 Malignant neoplasm of lower-outer quadrant of left female breast: Secondary | ICD-10-CM

## 2021-06-12 DIAGNOSIS — Z170 Estrogen receptor positive status [ER+]: Secondary | ICD-10-CM | POA: Insufficient documentation

## 2021-06-12 DIAGNOSIS — Z8261 Family history of arthritis: Secondary | ICD-10-CM | POA: Diagnosis not present

## 2021-06-12 DIAGNOSIS — Z8249 Family history of ischemic heart disease and other diseases of the circulatory system: Secondary | ICD-10-CM | POA: Diagnosis not present

## 2021-06-12 DIAGNOSIS — D0512 Intraductal carcinoma in situ of left breast: Secondary | ICD-10-CM | POA: Diagnosis not present

## 2021-06-12 DIAGNOSIS — Z8349 Family history of other endocrine, nutritional and metabolic diseases: Secondary | ICD-10-CM | POA: Diagnosis not present

## 2021-06-12 HISTORY — PX: BREAST LUMPECTOMY WITH RADIOACTIVE SEED LOCALIZATION: SHX6424

## 2021-06-12 SURGERY — BREAST LUMPECTOMY WITH RADIOACTIVE SEED LOCALIZATION
Anesthesia: General | Site: Breast | Laterality: Left

## 2021-06-12 MED ORDER — PHENYLEPHRINE 40 MCG/ML (10ML) SYRINGE FOR IV PUSH (FOR BLOOD PRESSURE SUPPORT)
PREFILLED_SYRINGE | INTRAVENOUS | Status: DC | PRN
Start: 2021-06-12 — End: 2021-06-12
  Administered 2021-06-12 (×4): 40 ug via INTRAVENOUS

## 2021-06-12 MED ORDER — PROMETHAZINE HCL 25 MG/ML IJ SOLN: 6.2500 mg | INTRAMUSCULAR | Status: DC | PRN

## 2021-06-12 MED ORDER — EPHEDRINE 5 MG/ML INJ
INTRAVENOUS | Status: AC
  Filled 2021-06-12: qty 10

## 2021-06-12 MED ORDER — LIDOCAINE HCL 1 % IJ SOLN
INTRAMUSCULAR | Status: DC | PRN
  Administered 2021-06-12: 40 mL via INTRAMUSCULAR

## 2021-06-12 MED ORDER — ONDANSETRON HCL 4 MG/2ML IJ SOLN
INTRAMUSCULAR | Status: DC | PRN
  Administered 2021-06-12: 4 mg via INTRAVENOUS

## 2021-06-12 MED ORDER — LIDOCAINE 2% (20 MG/ML) 5 ML SYRINGE
INTRAMUSCULAR | Status: DC | PRN
  Administered 2021-06-12: 60 mg via INTRAVENOUS

## 2021-06-12 MED ORDER — MIDAZOLAM HCL 5 MG/5ML IJ SOLN
INTRAMUSCULAR | Status: DC | PRN
  Administered 2021-06-12: 2 mg via INTRAVENOUS

## 2021-06-12 MED ORDER — ORAL CARE MOUTH RINSE: 15.0000 mL | Freq: Once | OROMUCOSAL | Status: AC

## 2021-06-12 MED ORDER — CHLORHEXIDINE GLUCONATE CLOTH 2 % EX PADS: 6.0000 | MEDICATED_PAD | Freq: Once | CUTANEOUS | Status: DC

## 2021-06-12 MED ORDER — PROPOFOL 10 MG/ML IV BOLUS
INTRAVENOUS | Status: DC | PRN
  Administered 2021-06-12: 200 mg via INTRAVENOUS

## 2021-06-12 MED ORDER — DEXAMETHASONE SODIUM PHOSPHATE 10 MG/ML IJ SOLN
INTRAMUSCULAR | Status: DC | PRN
  Administered 2021-06-12: 10 mg via INTRAVENOUS

## 2021-06-12 MED ORDER — FENTANYL CITRATE (PF) 100 MCG/2ML IJ SOLN
INTRAMUSCULAR | Status: DC | PRN
  Administered 2021-06-12 (×4): 50 ug via INTRAVENOUS

## 2021-06-12 MED ORDER — AMISULPRIDE (ANTIEMETIC) 5 MG/2ML IV SOLN: 10.0000 mg | Freq: Once | INTRAVENOUS | Status: DC | PRN

## 2021-06-12 MED ORDER — KETOROLAC TROMETHAMINE 30 MG/ML IJ SOLN: 30.0000 mg | Freq: Once | INTRAMUSCULAR | Status: DC | PRN

## 2021-06-12 MED ORDER — CEFAZOLIN SODIUM-DEXTROSE 2-4 GM/100ML-% IV SOLN
2.0000 g | INTRAVENOUS | Status: AC
  Administered 2021-06-12: 2 g via INTRAVENOUS
  Filled 2021-06-12: qty 100

## 2021-06-12 MED ORDER — LIDOCAINE HCL (PF) 1 % IJ SOLN
INTRAMUSCULAR | Status: AC
  Filled 2021-06-12: qty 30

## 2021-06-12 MED ORDER — LACTATED RINGERS IV SOLN: INTRAVENOUS | Status: DC

## 2021-06-12 MED ORDER — ACETAMINOPHEN 500 MG PO TABS
1000.0000 mg | ORAL_TABLET | ORAL | Status: AC
  Administered 2021-06-12: 1000 mg via ORAL
  Filled 2021-06-12: qty 2

## 2021-06-12 MED ORDER — FENTANYL CITRATE (PF) 250 MCG/5ML IJ SOLN
INTRAMUSCULAR | Status: AC
  Filled 2021-06-12: qty 5

## 2021-06-12 MED ORDER — OXYCODONE HCL 5 MG PO TABS: 5.0000 mg | ORAL_TABLET | Freq: Four times a day (QID) | ORAL | 0 refills | Status: DC | PRN

## 2021-06-12 MED ORDER — OXYCODONE HCL 5 MG/5ML PO SOLN: 5.0000 mg | Freq: Once | ORAL | Status: AC | PRN

## 2021-06-12 MED ORDER — CHLORHEXIDINE GLUCONATE 0.12 % MT SOLN
15.0000 mL | Freq: Once | OROMUCOSAL | Status: AC
  Administered 2021-06-12: 15 mL via OROMUCOSAL
  Filled 2021-06-12: qty 15

## 2021-06-12 MED ORDER — ONDANSETRON HCL 4 MG/2ML IJ SOLN
INTRAMUSCULAR | Status: AC
  Filled 2021-06-12: qty 2

## 2021-06-12 MED ORDER — PHENYLEPHRINE 40 MCG/ML (10ML) SYRINGE FOR IV PUSH (FOR BLOOD PRESSURE SUPPORT)
PREFILLED_SYRINGE | INTRAVENOUS | Status: AC
  Filled 2021-06-12: qty 10

## 2021-06-12 MED ORDER — PROPOFOL 10 MG/ML IV BOLUS
INTRAVENOUS | Status: AC
  Filled 2021-06-12: qty 20

## 2021-06-12 MED ORDER — FENTANYL CITRATE (PF) 100 MCG/2ML IJ SOLN: 25.0000 ug | INTRAMUSCULAR | Status: DC | PRN

## 2021-06-12 MED ORDER — OXYCODONE HCL 5 MG PO TABS
5.0000 mg | ORAL_TABLET | Freq: Once | ORAL | Status: AC | PRN
  Administered 2021-06-12: 5 mg via ORAL

## 2021-06-12 MED ORDER — BUPIVACAINE-EPINEPHRINE (PF) 0.25% -1:200000 IJ SOLN
INTRAMUSCULAR | Status: AC
  Filled 2021-06-12: qty 30

## 2021-06-12 MED ORDER — MIDAZOLAM HCL 2 MG/2ML IJ SOLN
INTRAMUSCULAR | Status: AC
  Filled 2021-06-12: qty 2

## 2021-06-12 MED ORDER — OXYCODONE HCL 5 MG PO TABS
ORAL_TABLET | ORAL | Status: AC
  Filled 2021-06-12: qty 1

## 2021-06-12 MED ORDER — DEXAMETHASONE SODIUM PHOSPHATE 10 MG/ML IJ SOLN
INTRAMUSCULAR | Status: AC
  Filled 2021-06-12: qty 1

## 2021-06-12 SURGICAL SUPPLY — 41 items
BAG COUNTER SPONGE SURGICOUNT (BAG) ×2 IMPLANT
BINDER BREAST 3XL (GAUZE/BANDAGES/DRESSINGS) ×2 IMPLANT
BINDER BREAST LRG (GAUZE/BANDAGES/DRESSINGS) IMPLANT
BINDER BREAST XLRG (GAUZE/BANDAGES/DRESSINGS) IMPLANT
BLADE SURG 10 STRL SS (BLADE) ×2 IMPLANT
CANISTER SUCT 3000ML PPV (MISCELLANEOUS) IMPLANT
CHLORAPREP W/TINT 26 (MISCELLANEOUS) ×2 IMPLANT
CLIP VESOCCLUDE LG 6/CT (CLIP) ×2 IMPLANT
CLIP VESOCCLUDE MED 6/CT (CLIP) ×2 IMPLANT
COVER PROBE W GEL 5X96 (DRAPES) ×2 IMPLANT
COVER SURGICAL LIGHT HANDLE (MISCELLANEOUS) ×2 IMPLANT
DERMABOND ADVANCED (GAUZE/BANDAGES/DRESSINGS) ×1
DERMABOND ADVANCED .7 DNX12 (GAUZE/BANDAGES/DRESSINGS) ×1 IMPLANT
DEVICE DUBIN SPECIMEN MAMMOGRA (MISCELLANEOUS) ×2 IMPLANT
DRAPE CHEST BREAST 15X10 FENES (DRAPES) ×2 IMPLANT
DRSG PAD ABDOMINAL 8X10 ST (GAUZE/BANDAGES/DRESSINGS) ×2 IMPLANT
ELECT COATED BLADE 2.86 ST (ELECTRODE) ×2 IMPLANT
ELECT REM PT RETURN 9FT ADLT (ELECTROSURGICAL) ×2
ELECTRODE REM PT RTRN 9FT ADLT (ELECTROSURGICAL) ×1 IMPLANT
GAUZE SPONGE 4X4 12PLY STRL LF (GAUZE/BANDAGES/DRESSINGS) ×2 IMPLANT
GLOVE SURG ENC MOIS LTX SZ6 (GLOVE) ×2 IMPLANT
GLOVE SURG UNDER LTX SZ6.5 (GLOVE) ×2 IMPLANT
GOWN STRL REUS W/ TWL LRG LVL3 (GOWN DISPOSABLE) ×1 IMPLANT
GOWN STRL REUS W/TWL 2XL LVL3 (GOWN DISPOSABLE) ×2 IMPLANT
GOWN STRL REUS W/TWL LRG LVL3 (GOWN DISPOSABLE) ×1
KIT BASIN OR (CUSTOM PROCEDURE TRAY) ×2 IMPLANT
KIT MARKER MARGIN INK (KITS) ×2 IMPLANT
LIGHT WAVEGUIDE WIDE FLAT (MISCELLANEOUS) ×2 IMPLANT
NEEDLE HYPO 25GX1X1/2 BEV (NEEDLE) ×2 IMPLANT
NS IRRIG 1000ML POUR BTL (IV SOLUTION) ×2 IMPLANT
PACK GENERAL/GYN (CUSTOM PROCEDURE TRAY) ×2 IMPLANT
STRIP CLOSURE SKIN 1/2X4 (GAUZE/BANDAGES/DRESSINGS) ×2 IMPLANT
SUT MNCRL AB 4-0 PS2 18 (SUTURE) ×2 IMPLANT
SUT SILK 2 0 SH (SUTURE) IMPLANT
SUT VIC AB 2-0 SH 27 (SUTURE) ×1
SUT VIC AB 2-0 SH 27XBRD (SUTURE) ×1 IMPLANT
SUT VIC AB 3-0 SH 27 (SUTURE) ×1
SUT VIC AB 3-0 SH 27X BRD (SUTURE) ×1 IMPLANT
SYR CONTROL 10ML LL (SYRINGE) ×2 IMPLANT
TOWEL GREEN STERILE (TOWEL DISPOSABLE) ×2 IMPLANT
TOWEL GREEN STERILE FF (TOWEL DISPOSABLE) ×2 IMPLANT

## 2021-06-12 NOTE — Op Note (Addendum)
Left Breast Radioactive seed localized lumpectomy  Indications: This patient presents with history of left breast cancer, lower inner quadrant, grade 3 ductal carcinoma in situ, cTis receptors +/-  Pre-operative Diagnosis: left breast cancer  Post-operative Diagnosis: left breast cancer  Surgeon: Stark Klein   Assistant: Carlena Hurl, PA-C  Anesthesia: General endotracheal anesthesia  ASA Class: 3  Procedure Details  The patient was seen in the Holding Room. The risks, benefits, complications, treatment options, and expected outcomes were discussed with the patient. The possibilities of bleeding, infection, the need for additional procedures, failure to diagnose a condition, and creating a complication requiring other procedures or operations were discussed with the patient. The patient concurred with the proposed plan, giving informed consent.  The site of surgery properly noted/marked. The patient was taken to Operating Room # 8, identified, and the procedure verified as left breast seed bracketed lumpectomy.  The right breast and chest were prepped and draped in standard fashion. A inframedial circumareolar incision was made near the previously placed radioactive seed.  Dissection was carried down around the points of maximum signal intensity. The cautery was used to perform the dissection.   The specimen was inked with the margin marker paint kit.    Specimen radiography confirmed inclusion of the mammographic lesions, the clips, and the seeds.  The background signal in the breast was zero.  Hemostasis was achieved with cautery.  The cavity was marked with clips on each border other than the anterior border.  The wound was irrigated and closed with 3-0 vicryl interrupted deep dermal sutures and 4-0 monocryl running subcuticular suture.      Sterile dressings were applied. At the end of the operation, all sponge, instrument, and needle counts were correct.   Findings: Seed, clip in  specimen.  anterior margin is skin margin, posterior margin is pec    Estimated Blood Loss:  min         Specimens: left breast tissue with seeds         Complications:  None; patient tolerated the procedure well.         Disposition: PACU - hemodynamically stable.         Condition: stable

## 2021-06-12 NOTE — Discharge Instructions (Addendum)
[facility] Office Phone Number [phone]  BREAST BIOPSY/ PARTIAL MASTECTOMY: POST OP INSTRUCTIONS  Always review your discharge instruction sheet given to you by the facility where your surgery was performed.  IF YOU HAVE DISABILITY OR FAMILY LEAVE FORMS, YOU MUST BRING THEM TO THE OFFICE FOR PROCESSING.  DO NOT GIVE THEM TO YOUR DOCTOR.  A prescription for pain medication may be given to you upon discharge.  Take your pain medication as prescribed, if needed.  If narcotic pain medicine is not needed, then you may take acetaminophen (Tylenol) or ibuprofen (Advil) as needed. Take your usually prescribed medications unless otherwise directed If you need a refill on your pain medication, please contact your pharmacy.  They will contact our office to request authorization.  Prescriptions will not be filled after 5pm or on week-ends. You should eat very light the first 24 hours after surgery, such as soup, crackers, pudding, etc.  Resume your normal diet the day after surgery. Most patients will experience some swelling and bruising in the breast.  Ice packs and a good support bra will help.  Swelling and bruising can take several days to resolve.  It is common to experience some constipation if taking pain medication after surgery.  Increasing fluid intake and taking a stool softener will usually help or prevent this problem from occurring.  A mild laxative (Milk of Magnesia or Miralax) should be taken according to package directions if there are no bowel movements after 48 hours. Unless discharge instructions indicate otherwise, you may remove your bandages 48 hours after surgery, and you may shower at that time.  You may have steri-strips (small skin tapes) in place directly over the incision.  These strips should be left on the skin for 7-10 days.   Any sutures or staples will be removed at the office during your follow-up visit. ACTIVITIES:  You may resume regular daily activities  (gradually increasing) beginning the next day.  Wearing a good support bra or sports bra (or the breast binder) minimizes pain and swelling.  You may have sexual intercourse when it is comfortable. You may drive when you no longer are taking prescription pain medication, you can comfortably wear a seatbelt, and you can safely maneuver your car and apply brakes. RETURN TO WORK:  __________1 week_______________ You should see your doctor in the office for a follow-up appointment approximately two weeks after your surgery.  Your doctor's nurse will typically make your follow-up appointment when she calls you with your pathology report.  Expect your pathology report 2-3 business days after your surgery.  You may call to check if you do not hear from us after three days.   WHEN TO CALL YOUR DOCTOR: Fever over 101.0 Nausea and/or vomiting. Extreme swelling or bruising. Continued bleeding from incision. Increased pain, redness, or drainage from the incision.  The clinic staff is available to answer your questions during regular business hours.  Please don't hesitate to call and ask to speak to one of the nurses for clinical concerns.  If you have a medical emergency, go to the nearest emergency room or call 911.  A surgeon from [facility] is always on call at the hospital.  For further questions, please visit [email or web address]   

## 2021-06-12 NOTE — Transfer of Care (Signed)
Immediate Anesthesia Transfer of Care Note  Patient: Pressley Barsky  Procedure(s) Performed: LEFT BREAST LUMPECTOMY WITH RADIOACTIVE SEED LOCALIZATION X 2 (Left: Breast)  Patient Location: PACU  Anesthesia Type:General  Level of Consciousness: awake  Airway & Oxygen Therapy: Patient Spontanous Breathing and Patient connected to nasal cannula oxygen  Post-op Assessment: Report given to RN, Post -op Vital signs reviewed and stable and Patient moving all extremities X 4  Post vital signs: Reviewed and stable  Last Vitals:  Vitals Value Taken Time  BP    Temp    Pulse 101 06/12/21 1516  Resp 25 06/12/21 1516  SpO2 100 % 06/12/21 1516  Vitals shown include unvalidated device data.  Last Pain:  Vitals:   06/12/21 1215  TempSrc:   PainSc: 0-No pain         Complications: No notable events documented.

## 2021-06-12 NOTE — Interval H&P Note (Signed)
History and Physical Interval Note:  06/12/2021 1:05 PM  Destiny Little  has presented today for surgery, with the diagnosis of LEFT BREAST CANCER.  The various methods of treatment have been discussed with the patient and family. After consideration of risks, benefits and other options for treatment, the patient has consented to  Procedure(s) with comments: LEFT BREAST LUMPECTOMY WITH RADIOACTIVE SEED LOCALIZATION X 2 (Left) - 60 MINUTES ROOM 3 as a surgical intervention.  The patient's history has been reviewed, patient examined, no change in status, stable for surgery.  I have reviewed the patient's chart and labs.  Questions were answered to the patient's satisfaction.  Public house manager used for entire conversation)   Stark Klein

## 2021-06-12 NOTE — Anesthesia Procedure Notes (Signed)
Procedure Name: LMA Insertion Date/Time: 06/12/2021 1:55 PM Performed by: Ezequiel Kayser, CRNA Pre-anesthesia Checklist: Patient identified, Emergency Drugs available, Suction available and Patient being monitored Patient Re-evaluated:Patient Re-evaluated prior to induction Oxygen Delivery Method: Circle System Utilized Preoxygenation: Pre-oxygenation with 100% oxygen Induction Type: IV induction Ventilation: Mask ventilation without difficulty LMA: LMA inserted LMA Size: 4.0 Number of attempts: 1 Airway Equipment and Method: Bite block Placement Confirmation: positive ETCO2 Tube secured with: Tape Dental Injury: Teeth and Oropharynx as per pre-operative assessment

## 2021-06-13 ENCOUNTER — Encounter (HOSPITAL_COMMUNITY): Payer: Self-pay | Admitting: General Surgery

## 2021-06-13 NOTE — Anesthesia Postprocedure Evaluation (Signed)
Anesthesia Post Note  Patient: Destiny Little  Procedure(s) Performed: LEFT BREAST LUMPECTOMY WITH RADIOACTIVE SEED LOCALIZATION X 2 (Left: Breast)     Patient location during evaluation: PACU Anesthesia Type: General Level of consciousness: awake Pain management: pain level controlled Vital Signs Assessment: post-procedure vital signs reviewed and stable Respiratory status: spontaneous breathing, nonlabored ventilation, respiratory function stable and patient connected to nasal cannula oxygen Cardiovascular status: blood pressure returned to baseline and stable Postop Assessment: no apparent nausea or vomiting Anesthetic complications: no   No notable events documented.  Last Vitals:  Vitals:   06/12/21 1545 06/12/21 1600  BP: (!) 133/52 129/60  Pulse: 88 86  Resp: 16 15  Temp:  36.7 C  SpO2: 100% 99%    Last Pain:  Vitals:   06/12/21 1600  TempSrc:   PainSc: 3                  Karo Rog P Linnie Mcglocklin

## 2021-06-14 LAB — SURGICAL PATHOLOGY

## 2021-06-18 ENCOUNTER — Encounter: Payer: Self-pay | Admitting: *Deleted

## 2021-06-20 NOTE — Telephone Encounter (Signed)
Contacted patient in attempt to disclose results of genetic testing with assistance of interpreter Elmo Putt (ID: [id number]).  LVM with contact information requesting a call back.  Will also notify Dr. Burr Medico of negative genetic testing so that results can be discussed at next appointment if needed.

## 2021-07-01 DIAGNOSIS — C50312 Malignant neoplasm of lower-inner quadrant of left female breast: Secondary | ICD-10-CM | POA: Insufficient documentation

## 2021-07-08 NOTE — Progress Notes (Signed)
Radiation Oncology         [phone]) [phone] ________________________________  Name: Destiny Little MRN: [id number]  Date: 07/09/2021  DOB: [birth year]  Follow-Up Visit Note  Outpatient  CC: Trey Sailors, [place]  Truitt Merle, MD  Diagnosis:      ICD-10-CM   1. Ductal carcinoma in situ (DCIS) of left breast  D05.12 Pregnancy, urine      Stage 0 (cTis, cN0, cM0) left Breast LIQ, High-Grade Ductal Carcinoma in situ (DCIS), ER+ / PR-   CHIEF COMPLAINT: Here to discuss management of left breast DCIS  Narrative:  The patient returns today for follow-up.     Since consultation date (05/22/21), the patient received genetic counseling. Results from genetic testing were negative.   The patient underwent left breast lumpectomy (by Dr. Barry Dienes) on date of 06/12/21 revealing: high-grade ductal carcinoma in situ with necrosis present. DCIS measuring 4.8 x 3.4 x 2.6 cm with margins not involved; DCIS focally 0.1 cm from the posterior margin and 0.5 cm from the medial margin. ER status: 30%, positive with weak staining intensity; PR status 0%, negative.   Symptomatically, the patient reports feeling well following her recent procedure during a follow-up with Dr. Barry Dienes on 07/01/21 . She did not require any narcotic pain medication, and denied having fever/chills. Upon physical examination performed during this visit, left breast showed no appreciable seroma or erythema. (Of note: the patient followed up with Carlena Hurl, PA at [facility] on 06/18/21 in which the patient reported some drainage from her left lumpectomy incision site as well as itchiness and rash. No signs of infection were noted and the drainage seems to have resolved since).    Lymphedema issues, if any:  Patient denies    Pain issues, if any:  Patient denies   SAFETY ISSUES: Prior radiation? No Pacemaker/ICD? No Possible current pregnancy? No--hysterectomy  Is the patient on methotrexate? No  Current  Complaints / other details:  Patient has received the first 2 Pfizer vaccines          ALLERGIES:  has No Known Allergies.  Meds: Current Outpatient Medications  Medication Sig Dispense Refill   Cholecalciferol (VITAMIN D3) 1.25 MG (50000 UT) CAPS Take 1 capsule by mouth.     hydrOXYzine (VISTARIL) 25 MG capsule Take 25 mg by mouth daily as needed.     ibuprofen (ADVIL) 200 MG tablet Take 200 mg by mouth every 6 (six) hours as needed for moderate pain.     losartan-hydrochlorothiazide (HYZAAR) 50-12.5 MG tablet Take 1 tablet by mouth daily.     potassium chloride (KLOR-CON) 10 MEQ tablet Take 10 mEq by mouth daily.     oxyCODONE (OXY IR/ROXICODONE) 5 MG immediate release tablet Take 1 tablet (5 mg total) by mouth every 6 (six) hours as needed for severe pain. (Patient not taking: Reported on 07/09/2021) 10 tablet 0   No current facility-administered medications for this encounter.    Physical Findings:  height is '5\' 3"'$  (1.6 m) and weight is 244 lb 2 oz (110.7 kg). Her temporal temperature is 96.9 F (36.1 C) (abnormal). Her blood pressure is 135/61 and her pulse is 69. Her respiration is 18 and oxygen saturation is 100%. .     General: Alert and oriented, in no acute distress Heart: Regular in rate and rhythm with no murmurs, rubs, or gallops. Chest: Clear to auscultation bilaterally, with no rhonchi, wheezes, or rales. Abdomen: Soft, nontender, nondistended, with no rigidity or guarding. Musculoskeletal: symmetric strength  and muscle tone throughout. Neurologic: No obvious focalities. Speech is fluent.  Psychiatric: Judgment and insight are intact. Affect is appropriate. Breast exam reveals satisfactory healing, left lumpectomy scar  Lab Findings: Lab Results  Component Value Date   WBC 8.0 06/06/2021   HGB 12.9 06/06/2021   HCT 40.5 06/06/2021   MCV 96.7 06/06/2021   PLT 466 (H) 06/06/2021    '@LASTCHEMISTRY'$ @  Radiographic Findings: No results found.  Impression/Plan:  DCIS LEFT BREAST  We discussed adjuvant radiotherapy today.  I recommend radiation to the left breast in order to reduce risk of local recurrence by  at least half.  I reviewed the logistics, benefits, risks, and potential side effects of this treatment in detail. Risks may include but not necessary be limited to acute and late injury tissue in the radiation fields such as skin irritation (change in color/pigmentation, itching, dryness, pain, peeling). She may experience fatigue. We also discussed possible risk of long term cosmetic changes or scar tissue. There is also a smaller risk for lung toxicity, cardiac toxicity, lymphedema, musculoskeletal changes, rib fragility or induction of a second malignancy, late chronic non-healing soft tissue wound.    The patient asked good questions which I answered to her satisfaction. She is enthusiastic about proceeding with treatment. A consent form has been signed and placed in her chart.  Simulation today. Start treatment in early August   Translator present for encounter.  On date of service, in total, I spent 30 minutes on this encounter. Patient was seen in person.  _____________________________________   Eppie Gibson, MD  This document serves as a record of services personally performed by Eppie Gibson, MD. It was created on her behalf by Roney Mans, a trained medical scribe. The creation of this record is based on the scribe's personal observations and the provider's statements to them. This document has been checked and approved by the attending provider.

## 2021-07-08 NOTE — Progress Notes (Signed)
Location of Breast Cancer:  Ductal carcinoma in situ (DCIS) of LEFT breast  Histology per Pathology Report:  06/12/2021 FINAL MICROSCOPIC DIAGNOSIS:  A. BREAST, LEFT, LUMPECTOMY:  - Ductal carcinoma in situ, 4.8 cm.  - Margins not involved.  - DCIS focally 0.1 cm from posterior margin and 0.5 cm from medial margin.  - Biopsy site and biopsy clip.   Receptor Status: ER(30%), PR (0%)  Did patient present with symptoms (if so, please note symptoms) or was this found on screening mammography?:  05/07/2021 screening mammogram found 5 cm group of suspicious calcifications in the lower-inner quadrant of the left breast, and 2 small adjacent groups of calcifications in the upper-outer quadrant of the right breast measuring 0.6 and 0.5 cm respectively, which may represent fibrocystic changes  Past/Anticipated interventions by surgeon, if any:  06/12/2021 Dr. Stark Klein Left Breast Radioactive seed localized lumpectomy  Past/Anticipated interventions by medical oncology, if any:  Under care of Dr. Truitt Merle 05/22/2021 -Given her positive ER in DCIS and her young age, I do recommend antiestrogen therapy with Tamoxifen, which decrease her risk of future breast cancer by half.  -She will likely benefit from breast radiation if she undergo lumpectomy to decrease the risk of breast cancer. She will discuss this further with Dr. Isidore Moos today. -We also discussed that biopsy may have sampling limitation, we will review her surgical path, to see if she has any invasive carcinoma components. -She notes she is open to both adjuvant Radiation and Antiestrogen therapy.  -We also discussed the breast cancer surveillance after her surgery. She will continue annual screening mammogram, self exams, and a routine office visit with lab and exam with [place]. I discussed the option of additional screening with annual breast MRIs. I also discussed Abbreviated MRIs which have $400 out-of-pocket cost if insurance does not  cover this. -Labs today show CBC and CMP WNL except plt 483K, BG 140. -Proceed with surgery soon. F/u after surgery or Radiation.  -Given her age, she is eligible for genetic testing. She is interested.  A) 06/20/2021 Cari Koerner--LGC: "Will also notify Dr. Burr Medico of negative genetic testing so that results can be discussed at next appointment if needed"  Lymphedema issues, if any:  Patient denies    Pain issues, if any:  Patient denies   SAFETY ISSUES: Prior radiation? No Pacemaker/ICD? No Possible current pregnancy? No--hysterectomy  Is the patient on methotrexate? No  Current Complaints / other details:  Patient has received the first 2 Pfizer vaccines

## 2021-07-09 ENCOUNTER — Other Ambulatory Visit: Payer: Self-pay

## 2021-07-09 ENCOUNTER — Ambulatory Visit
Admission: RE | Admit: 2021-07-09 | Discharge: 2021-07-09 | Disposition: A | Discharge status: Home / Self Care | Payer: 59 | Source: Ambulatory Visit | Attending: Radiation Oncology | Admitting: Radiation Oncology

## 2021-07-09 VITALS — BP 135/61 | HR 69 | Temp 96.9°F | Resp 18 | Ht 63.0 in | Wt 244.1 lb

## 2021-07-09 DIAGNOSIS — D0512 Intraductal carcinoma in situ of left breast: Secondary | ICD-10-CM

## 2021-07-09 DIAGNOSIS — R21 Rash and other nonspecific skin eruption: Secondary | ICD-10-CM | POA: Insufficient documentation

## 2021-07-09 DIAGNOSIS — Z170 Estrogen receptor positive status [ER+]: Secondary | ICD-10-CM | POA: Insufficient documentation

## 2021-07-09 DIAGNOSIS — Z923 Personal history of irradiation: Secondary | ICD-10-CM | POA: Insufficient documentation

## 2021-07-09 DIAGNOSIS — Z79899 Other long term (current) drug therapy: Secondary | ICD-10-CM | POA: Insufficient documentation

## 2021-07-09 LAB — PREGNANCY, URINE: Preg Test, Ur: NEGATIVE

## 2021-07-12 DIAGNOSIS — D0512 Intraductal carcinoma in situ of left breast: Secondary | ICD-10-CM | POA: Diagnosis not present

## 2021-07-13 ENCOUNTER — Encounter: Payer: Self-pay | Admitting: Radiation Oncology

## 2021-07-15 ENCOUNTER — Encounter: Payer: Self-pay | Admitting: *Deleted

## 2021-07-15 DIAGNOSIS — Z923 Personal history of irradiation: Secondary | ICD-10-CM

## 2021-07-15 HISTORY — DX: Personal history of irradiation: Z92.3

## 2021-07-16 ENCOUNTER — Telehealth: Payer: Self-pay | Admitting: Hematology

## 2021-07-16 NOTE — Telephone Encounter (Signed)
Scheduled appt per 8/1 sch msg. Left msg using interpreter services with appt date and time.

## 2021-07-18 ENCOUNTER — Ambulatory Visit
Admission: RE | Admit: 2021-07-18 | Discharge: 2021-07-18 | Disposition: A | Discharge status: Home / Self Care | Payer: 59 | Source: Ambulatory Visit | Attending: Radiation Oncology | Admitting: Radiation Oncology

## 2021-07-18 ENCOUNTER — Other Ambulatory Visit: Payer: Self-pay

## 2021-07-18 ENCOUNTER — Telehealth: Payer: Self-pay

## 2021-07-18 ENCOUNTER — Encounter: Payer: Self-pay | Admitting: Licensed Clinical Social Worker

## 2021-07-18 DIAGNOSIS — Z510 Encounter for antineoplastic radiation therapy: Secondary | ICD-10-CM | POA: Insufficient documentation

## 2021-07-18 DIAGNOSIS — D0512 Intraductal carcinoma in situ of left breast: Secondary | ICD-10-CM | POA: Diagnosis not present

## 2021-07-18 NOTE — Telephone Encounter (Signed)
   Layah Madson Betances Octaviano Glow DOB: 05/03/1971 MRN: [id number]   RIDER WAIVER AND RELEASE OF LIABILITY  For purposes of improving physical access to our facilities, [facility] is pleased to partner with third parties to provide [facility] patients or other authorized individuals the option of convenient, on-demand ground transportation services (the Technical brewer") through use of the technology service that enables users to request on-demand ground transportation from independent third-party providers.  By opting to use and accept these [employer], I, the undersigned, hereby agree on behalf of myself, and on behalf of any minor child using the Government social research officer for whom I am the parent or legal guardian, as follows:  Government social research officer provided to me are provided by independent third-party transportation providers who are not [employer] or employees and who are unaffiliated with [employer]. [facility] is neither a transportation carrier nor a common or public carrier. [facility] has no control over the quality or safety of the transportation that occurs as a result of the [employer]. [facility] cannot guarantee that any third-party transportation provider will complete any arranged transportation service. [facility] makes no representation, warranty, or guarantee regarding the reliability, timeliness, quality, safety, suitability, or availability of any of the Transport Services or that they will be error free. I fully understand that traveling by vehicle involves risks and dangers of serious bodily injury, including permanent disability, paralysis, and death. I agree, on behalf of myself and on behalf of any minor child using the Transport Services for whom I am the parent or legal guardian, that the entire risk arising out of my use of the [employer] remains solely with me, to the maximum extent permitted under applicable law. The [employer]  are provided "as is" and "as available." [facility] disclaims all representations and warranties, express, implied or statutory, not expressly set out in these terms, including the implied warranties of merchantability and fitness for a particular purpose. I hereby waive and release [employer], its agents, employees, officers, directors, representatives, insurers, attorneys, assigns, successors, subsidiaries, and affiliates from any and all past, present, or future claims, demands, liabilities, actions, causes of action, or suits of any kind directly or indirectly arising from acceptance and use of the [employer]. I further waive and release [facility] and its affiliates from all present and future liability and responsibility for any injury or death to persons or damages to property caused by or related to the use of the [employer]. I have read this Waiver and Release of Liability, and I understand the terms used in it and their legal significance. This Waiver is freely and voluntarily given with the understanding that my right (as well as the right of any minor child for whom I am the parent or legal guardian using the [employer]) to legal recourse against [place] in connection with the [employer] is knowingly surrendered in return for use of these services.   I attest that I read the consent document to Nolene Ebbs, gave Ms. Betances Octaviano Glow the opportunity to ask questions and answered the questions asked (if any). I affirm that Nolene Ebbs then provided consent for she's participation in this program.     Drucie Ip

## 2021-07-18 NOTE — Progress Notes (Signed)
Destiny Little  Holiday representative received VM from pt's daughter asking for referral to [facility] transportation to help with rides for radiation. CSW spoke with daughter and then made referral to transportation team. They will contact daughter & patient to confirm and set up rides.     [place], [place] Worker [employer]

## 2021-07-19 ENCOUNTER — Encounter: Payer: Self-pay | Admitting: Licensed Clinical Social Worker

## 2021-07-19 ENCOUNTER — Ambulatory Visit
Admission: RE | Admit: 2021-07-19 | Discharge: 2021-07-19 | Disposition: A | Discharge status: Home / Self Care | Payer: 59 | Source: Ambulatory Visit | Attending: Radiation Oncology | Admitting: Radiation Oncology

## 2021-07-19 DIAGNOSIS — Z510 Encounter for antineoplastic radiation therapy: Secondary | ICD-10-CM | POA: Diagnosis not present

## 2021-07-19 NOTE — Progress Notes (Signed)
[facility] CSW Progress Note  Patient brought in paperwork for applications to cancer foundations. CSW submitted [employer] application today.  Placed Pretty in [place] and [facility] [employer] on [place] Anne/Abigail's desk for completion / waiting on additional documents.    Christeen Douglas , LCSW

## 2021-07-22 ENCOUNTER — Other Ambulatory Visit: Payer: Self-pay

## 2021-07-22 ENCOUNTER — Ambulatory Visit
Admission: RE | Admit: 2021-07-22 | Discharge: 2021-07-22 | Disposition: A | Discharge status: Home / Self Care | Payer: 59 | Source: Ambulatory Visit | Attending: Radiation Oncology | Admitting: Radiation Oncology

## 2021-07-22 DIAGNOSIS — Z510 Encounter for antineoplastic radiation therapy: Secondary | ICD-10-CM | POA: Diagnosis not present

## 2021-07-22 DIAGNOSIS — D0512 Intraductal carcinoma in situ of left breast: Secondary | ICD-10-CM

## 2021-07-22 MED ORDER — ALRA NON-METALLIC DEODORANT (RAD-ONC)
1.0000 "application " | Freq: Once | TOPICAL | Status: AC
  Administered 2021-07-22: 1 via TOPICAL

## 2021-07-22 MED ORDER — RADIAPLEXRX EX GEL: Freq: Once | CUTANEOUS | Status: AC

## 2021-07-22 NOTE — Progress Notes (Signed)
Pt here for patient teaching.    Pt given Radiation and You booklet, Alra deodorant, and Radiaplex gel.  Education reviewed in [place] via interpreter   Reviewed areas of pertinence such as fatigue, hair loss, skin changes, breast tenderness, and breast swelling .   Pt able to give teach back of to pat skin, use unscented/gentle soap, and drink plenty of water,apply Radiaplex bid, avoid applying anything to skin within 4 hours of treatment, avoid wearing an under wire bra, and to use an electric razor if they must shave.   Pt demonstrated understanding and verbalizes understanding of information given and will contact nursing with any questions or concerns.    Http://rtanswers.org/treatmentinformation/whattoexpect/index

## 2021-07-23 ENCOUNTER — Other Ambulatory Visit: Payer: Self-pay

## 2021-07-23 ENCOUNTER — Ambulatory Visit
Admission: RE | Admit: 2021-07-23 | Discharge: 2021-07-23 | Disposition: A | Discharge status: Home / Self Care | Payer: 59 | Source: Ambulatory Visit | Attending: Radiation Oncology | Admitting: Radiation Oncology

## 2021-07-23 DIAGNOSIS — Z510 Encounter for antineoplastic radiation therapy: Secondary | ICD-10-CM | POA: Diagnosis not present

## 2021-07-24 ENCOUNTER — Ambulatory Visit
Admission: RE | Admit: 2021-07-24 | Discharge: 2021-07-24 | Disposition: A | Discharge status: Home / Self Care | Payer: 59 | Source: Ambulatory Visit | Attending: Radiation Oncology | Admitting: Radiation Oncology

## 2021-07-24 DIAGNOSIS — Z510 Encounter for antineoplastic radiation therapy: Secondary | ICD-10-CM | POA: Diagnosis not present

## 2021-07-25 ENCOUNTER — Encounter: Payer: Self-pay | Admitting: General Practice

## 2021-07-25 ENCOUNTER — Other Ambulatory Visit: Payer: Self-pay

## 2021-07-25 ENCOUNTER — Ambulatory Visit
Admission: RE | Admit: 2021-07-25 | Discharge: 2021-07-25 | Disposition: A | Discharge status: Home / Self Care | Payer: 59 | Source: Ambulatory Visit | Attending: Radiation Oncology | Admitting: Radiation Oncology

## 2021-07-25 DIAGNOSIS — Z510 Encounter for antineoplastic radiation therapy: Secondary | ICD-10-CM | POA: Diagnosis not present

## 2021-07-25 NOTE — Progress Notes (Signed)
[facility] CSW Progress Notes  [place] in [place] assistance application submitted on patient behalf by mail.  Per notes, CSW [place] submitted [employer] application on August 8th.  CSW unable to submit [place] due to insufficient documentation - patient must demonstrate working at time of diagnosis and resultant loss in work hours due to cancer diagnosis and treatment.  Patient has been working throughout treatment, except for period of recovery from surgery.  She will request this documentation from employer.  She will also need to submit bills for expenses like rent, utilities, car payment, phone - daughter to provide this documentation as expenses are shared.  Unable to submit [employer] application as grant expired 7/15./2022.  Urged daughter to provide additional documentation ASAP for [facility] as this application must be submitted while in active treatment (ends August 2022).  Daughter says she will bring additional documents tomorrow.   Edwyna Shell, LCSW Clinical Social Worker Phone:  [phone]

## 2021-07-26 ENCOUNTER — Ambulatory Visit
Admission: RE | Admit: 2021-07-26 | Discharge: 2021-07-26 | Disposition: A | Discharge status: Home / Self Care | Payer: 59 | Source: Ambulatory Visit | Attending: Radiation Oncology | Admitting: Radiation Oncology

## 2021-07-26 DIAGNOSIS — Z510 Encounter for antineoplastic radiation therapy: Secondary | ICD-10-CM | POA: Diagnosis not present

## 2021-07-29 ENCOUNTER — Other Ambulatory Visit: Payer: Self-pay

## 2021-07-29 ENCOUNTER — Ambulatory Visit
Admission: RE | Admit: 2021-07-29 | Discharge: 2021-07-29 | Disposition: A | Discharge status: Home / Self Care | Payer: 59 | Source: Ambulatory Visit | Attending: Radiation Oncology | Admitting: Radiation Oncology

## 2021-07-29 ENCOUNTER — Encounter: Payer: Self-pay | Admitting: *Deleted

## 2021-07-29 DIAGNOSIS — Z510 Encounter for antineoplastic radiation therapy: Secondary | ICD-10-CM | POA: Diagnosis not present

## 2021-07-29 NOTE — Progress Notes (Signed)
[facility] Work  Clinical Social Work submitted application to [employer].  Patient will be notified once application has been reviewed.     Johnnye Lana, MSW, LCSW, OSW-C Clinical Social Worker [facility] [phone]

## 2021-07-30 ENCOUNTER — Ambulatory Visit
Admission: RE | Admit: 2021-07-30 | Discharge: 2021-07-30 | Disposition: A | Discharge status: Home / Self Care | Payer: 59 | Source: Ambulatory Visit | Attending: Radiation Oncology | Admitting: Radiation Oncology

## 2021-07-30 DIAGNOSIS — Z510 Encounter for antineoplastic radiation therapy: Secondary | ICD-10-CM | POA: Diagnosis not present

## 2021-07-31 ENCOUNTER — Other Ambulatory Visit: Payer: Self-pay

## 2021-07-31 ENCOUNTER — Encounter: Payer: Self-pay | Admitting: General Practice

## 2021-07-31 ENCOUNTER — Ambulatory Visit
Admission: RE | Admit: 2021-07-31 | Discharge: 2021-07-31 | Disposition: A | Discharge status: Home / Self Care | Payer: 59 | Source: Ambulatory Visit | Attending: Radiation Oncology | Admitting: Radiation Oncology

## 2021-07-31 DIAGNOSIS — Z510 Encounter for antineoplastic radiation therapy: Secondary | ICD-10-CM | POA: Diagnosis not present

## 2021-07-31 NOTE — Progress Notes (Signed)
[facility] CSW Progress Notes  Email from [employer] - patient has been approved for [employer] for unpaid medical bills.  They will inform patient.  Santa Genera, LCSW Clinical Social Worker Phone:  [phone]

## 2021-08-01 ENCOUNTER — Ambulatory Visit
Admission: RE | Admit: 2021-08-01 | Discharge: 2021-08-01 | Disposition: A | Discharge status: Home / Self Care | Payer: 59 | Source: Ambulatory Visit | Attending: Radiation Oncology | Admitting: Radiation Oncology

## 2021-08-01 DIAGNOSIS — Z510 Encounter for antineoplastic radiation therapy: Secondary | ICD-10-CM | POA: Diagnosis not present

## 2021-08-02 ENCOUNTER — Other Ambulatory Visit: Payer: Self-pay

## 2021-08-02 ENCOUNTER — Ambulatory Visit
Admission: RE | Admit: 2021-08-02 | Discharge: 2021-08-02 | Disposition: A | Discharge status: Home / Self Care | Payer: 59 | Source: Ambulatory Visit | Attending: Radiation Oncology | Admitting: Radiation Oncology

## 2021-08-02 DIAGNOSIS — Z510 Encounter for antineoplastic radiation therapy: Secondary | ICD-10-CM | POA: Diagnosis not present

## 2021-08-05 ENCOUNTER — Ambulatory Visit
Admission: RE | Admit: 2021-08-05 | Discharge: 2021-08-05 | Disposition: A | Discharge status: Home / Self Care | Payer: 59 | Source: Ambulatory Visit | Attending: Radiation Oncology | Admitting: Radiation Oncology

## 2021-08-05 ENCOUNTER — Ambulatory Visit: Payer: 59 | Admitting: Radiation Oncology

## 2021-08-05 ENCOUNTER — Other Ambulatory Visit: Payer: Self-pay

## 2021-08-05 DIAGNOSIS — D0512 Intraductal carcinoma in situ of left breast: Secondary | ICD-10-CM

## 2021-08-05 DIAGNOSIS — Z510 Encounter for antineoplastic radiation therapy: Secondary | ICD-10-CM | POA: Diagnosis not present

## 2021-08-05 MED ORDER — RADIAPLEXRX EX GEL: Freq: Once | CUTANEOUS | Status: AC

## 2021-08-06 ENCOUNTER — Ambulatory Visit
Admission: RE | Admit: 2021-08-06 | Discharge: 2021-08-06 | Disposition: A | Discharge status: Home / Self Care | Payer: 59 | Source: Ambulatory Visit | Attending: Radiation Oncology | Admitting: Radiation Oncology

## 2021-08-06 DIAGNOSIS — Z510 Encounter for antineoplastic radiation therapy: Secondary | ICD-10-CM | POA: Diagnosis not present

## 2021-08-07 ENCOUNTER — Other Ambulatory Visit: Payer: Self-pay

## 2021-08-07 ENCOUNTER — Ambulatory Visit
Admission: RE | Admit: 2021-08-07 | Discharge: 2021-08-07 | Disposition: A | Discharge status: Home / Self Care | Payer: 59 | Source: Ambulatory Visit | Attending: Radiation Oncology | Admitting: Radiation Oncology

## 2021-08-07 ENCOUNTER — Encounter: Payer: Self-pay | Admitting: Radiation Oncology

## 2021-08-07 DIAGNOSIS — Z510 Encounter for antineoplastic radiation therapy: Secondary | ICD-10-CM | POA: Diagnosis not present

## 2021-08-08 ENCOUNTER — Encounter: Payer: Self-pay | Admitting: Licensed Clinical Social Worker

## 2021-08-08 ENCOUNTER — Ambulatory Visit
Admission: RE | Admit: 2021-08-08 | Discharge: 2021-08-08 | Disposition: A | Discharge status: Home / Self Care | Payer: 59 | Source: Ambulatory Visit | Attending: Radiation Oncology | Admitting: Radiation Oncology

## 2021-08-08 ENCOUNTER — Other Ambulatory Visit: Payer: Self-pay

## 2021-08-08 DIAGNOSIS — Z510 Encounter for antineoplastic radiation therapy: Secondary | ICD-10-CM | POA: Diagnosis not present

## 2021-08-08 NOTE — Progress Notes (Signed)
[facility] CSW Progress Note  [facility] representative received copies of medical bills from pt to submit to [employer] [place]. Bills faxed to Vicente Males with [employer] [facility] [place] for assistance.     Christeen Douglas , LCSW

## 2021-08-09 ENCOUNTER — Other Ambulatory Visit: Payer: Self-pay

## 2021-08-09 ENCOUNTER — Ambulatory Visit
Admission: RE | Admit: 2021-08-09 | Discharge: 2021-08-09 | Disposition: A | Discharge status: Home / Self Care | Payer: 59 | Source: Ambulatory Visit | Attending: Radiation Oncology | Admitting: Radiation Oncology

## 2021-08-09 ENCOUNTER — Inpatient Hospital Stay: Payer: 59 | Attending: Hematology | Admitting: Hematology

## 2021-08-09 DIAGNOSIS — Z510 Encounter for antineoplastic radiation therapy: Secondary | ICD-10-CM | POA: Diagnosis not present

## 2021-08-12 ENCOUNTER — Other Ambulatory Visit: Payer: Self-pay

## 2021-08-12 ENCOUNTER — Encounter: Payer: Self-pay | Admitting: Licensed Clinical Social Worker

## 2021-08-12 ENCOUNTER — Telehealth: Payer: Self-pay | Admitting: Hematology

## 2021-08-12 ENCOUNTER — Ambulatory Visit
Admission: RE | Admit: 2021-08-12 | Discharge: 2021-08-12 | Disposition: A | Discharge status: Home / Self Care | Payer: 59 | Source: Ambulatory Visit | Attending: Radiation Oncology | Admitting: Radiation Oncology

## 2021-08-12 DIAGNOSIS — Z510 Encounter for antineoplastic radiation therapy: Secondary | ICD-10-CM | POA: Diagnosis not present

## 2021-08-12 NOTE — Progress Notes (Signed)
[name] CSW Progress Note  Patient came to support services to ask for an update on [employer] application. Application was submitted by A. Elmore on 07/29/2021. Informed patient that it takes 30-60 days to process that application and that [employer] typically provides updates via e-mail.  Patient also asked about applying for Medicaid/ government insurance (currently has [place]). CSW provided information on applying online or at [place] and pt said she will go to DSS office.    Patient also asked if there is any other help available once she receives her bills from radiation treatment. Pt has applied to all breast cancer foundations. Discussed that she may be able to work with financial advocates to apply for assistance through [facility] if the total meets hardship requirements.    Christeen Douglas , LCSW

## 2021-08-12 NOTE — Telephone Encounter (Signed)
Scheduled appointment per 08/26 sch msg. Left message.

## 2021-08-13 ENCOUNTER — Encounter: Payer: Self-pay | Admitting: *Deleted

## 2021-08-13 ENCOUNTER — Ambulatory Visit
Admission: RE | Admit: 2021-08-13 | Discharge: 2021-08-13 | Disposition: A | Discharge status: Home / Self Care | Payer: 59 | Source: Ambulatory Visit | Attending: Radiation Oncology | Admitting: Radiation Oncology

## 2021-08-13 ENCOUNTER — Other Ambulatory Visit: Payer: Self-pay

## 2021-08-13 ENCOUNTER — Other Ambulatory Visit: Payer: Self-pay | Admitting: *Deleted

## 2021-08-13 DIAGNOSIS — Z510 Encounter for antineoplastic radiation therapy: Secondary | ICD-10-CM | POA: Diagnosis not present

## 2021-08-13 DIAGNOSIS — D0512 Intraductal carcinoma in situ of left breast: Secondary | ICD-10-CM

## 2021-08-14 ENCOUNTER — Ambulatory Visit
Admission: RE | Admit: 2021-08-14 | Discharge: 2021-08-14 | Disposition: A | Discharge status: Home / Self Care | Payer: 59 | Source: Ambulatory Visit | Attending: Radiation Oncology | Admitting: Radiation Oncology

## 2021-08-14 DIAGNOSIS — Z510 Encounter for antineoplastic radiation therapy: Secondary | ICD-10-CM | POA: Diagnosis not present

## 2021-08-16 ENCOUNTER — Other Ambulatory Visit: Payer: Self-pay

## 2021-08-16 ENCOUNTER — Inpatient Hospital Stay: Payer: 59 | Attending: Hematology | Admitting: Hematology

## 2021-08-16 ENCOUNTER — Encounter: Payer: Self-pay | Admitting: Hematology

## 2021-08-16 VITALS — BP 130/66 | HR 71 | Temp 98.3°F | Resp 17 | Wt 240.7 lb

## 2021-08-16 DIAGNOSIS — D0512 Intraductal carcinoma in situ of left breast: Secondary | ICD-10-CM | POA: Diagnosis not present

## 2021-08-16 DIAGNOSIS — Z170 Estrogen receptor positive status [ER+]: Secondary | ICD-10-CM | POA: Insufficient documentation

## 2021-08-16 MED ORDER — TAMOXIFEN CITRATE 20 MG PO TABS: 20.0000 mg | ORAL_TABLET | Freq: Every day | ORAL | 3 refills | Status: DC

## 2021-08-16 MED ORDER — RADIAPLEXRX EX GEL: 1.0000 "application " | Freq: Two times a day (BID) | CUTANEOUS | 0 refills | Status: DC

## 2021-08-16 NOTE — Progress Notes (Signed)
[facility]   Telephone:([phone] Fax:([phone]) [phone]   Clinic Follow up Note   Patient Care Team: Trey Sailors, PA as PCP - General (Physician Assistant) Rockwell Germany, RN as Oncology Nurse Navigator Mauro Kaufmann, RN as Oncology Nurse Navigator Truitt Merle, MD as Consulting Physician (Hematology) Stark Klein, MD as Consulting Physician (General Surgery) Eppie Gibson, MD as Attending Physician (Radiation Oncology)  Date of Service:  08/16/2021  CHIEF COMPLAINT: f/u of left breast DCIS  CURRENT THERAPY:  To start tamoxifen  ASSESSMENT & PLAN:  Destiny Little is a 50 y.o. female with   1. Left breast DCIS, grade III, ER30%+/PR-  -calcium deposits were seen on screening mammogram. Biopsy 05/15/21 showed high grade DCIS, ER 30%+. Additional right breast biopsies on 05/23/21 were benign. -she underwent left lumpectomy on 06/12/21 with Dr. Barry Dienes. Pathology again showed high grade DCIS, 4.8 cm. Margins not involved. -She received adjuvant radiation under Dr. Isidore Moos from 07/18/21 - 08/14/21. -Given her positive ER in DCIS and her young age, I do recommend antiestrogen therapy with Tamoxifen, which decrease her risk of future breast cancer.The potential side effects, which includes but not limited to, hot flash, skin and vaginal dryness, slightly increased risk of cardiovascular disease and cataract, small risk of thrombosis and endometrial cancer, were discussed with her in great details. Preventive strategies for thrombosis, such as being physically active, using compression stocks, avoid cigarette smoking, etc., were reviewed with her. I also recommend her to follow-up with her gynecologist once a year, and watch for abnormal vaginal spotting or bleeding, as a clinically sign of endometrial cancer, etc. She voiced good understanding, and agrees to proceed.  -We also reviewed the breast cancer surveillance, including annual mammogram, and physical exam.     2. Genetics  -Given her age, she is eligible for genetic testing. -Testing performed 05/22/21. Results were negative.  3. Social and Financial Support -she reports she is single with three daughters. She lives with one of them and her granddaughter. -she has applied for financial assistance with several breast cancer foundations. She is awaiting response from [facility], which was submitted 07/29/21.     PLAN:  -start tamoxifen in about a month, I called in today -survivorship clinic in 3 months -labs and f/u in 6 months   No problem-specific Assessment & Plan notes found for this encounter.   SUMMARY OF ONCOLOGIC HISTORY: Oncology History Overview Note  Cancer Staging Ductal carcinoma in situ (DCIS) of left breast Staging form: Breast, AJCC 8th Edition - Clinical stage from 05/15/2021: Stage 0 (cTis (DCIS), cN0, cM0, G3, ER+, PR-, HER2: Not Assessed) - Signed by Truitt Merle, MD on 05/21/2021 Stage prefix: Initial diagnosis Histologic grading system: 3 grade system - Pathologic stage from 06/12/2021: Stage Unknown (pTis (DCIS), pNX, G3, ER+, PR-, HER2-) - Signed by Truitt Merle, MD on 08/16/2021 Stage prefix: Initial diagnosis Histologic grading system: 3 grade system Residual tumor (R): R0 - None    Ductal carcinoma in situ (DCIS) of left breast  05/07/2021 Mammogram   IMPRESSION: 1. There is a 5 cm group of suspicious calcifications in the lower-inner quadrant of the left breast.   2. There are 2 small adjacent groups of calcifications in the upper-outer quadrant of the right breast measuring 0.6 and 0.5 cm respectively, which may represent fibrocystic changes.   05/15/2021 Initial Biopsy   Diagnosis 1. Breast, left, needle core biopsy, left breast LIQ anterior - DUCTAL CARCINOMA IN SITU WITH CALCIFICATIONS AND  NECROSIS - SEE COMMENT 2. Breast, left, needle core biopsy, left breast LIQ posterior - DUCTAL CARCINOMA IN SITU WITH CALCIFICATIONS AND NECROSIS - SEE COMMENT Microscopic  Comment 1. and 2. Based on the biopsy, the ductal carcinoma in situ has a comedo pattern, high nuclear grade and measures 0.2 cm in greatest linear extent. Prognostic markers (ER/PR) are pending and will be reported in an addendum. Dr. Saralyn Pilar reviewed the case and agrees with the above diagnosis. These results were called to [facility] on May 16, 2021.   05/15/2021 Cancer Staging   Staging form: Breast, AJCC 8th Edition - Clinical stage from 05/15/2021: Stage 0 (cTis (DCIS), cN0, cM0, G3, ER+, PR-, HER2: Not Assessed) - Signed by Truitt Merle, MD on 05/21/2021 Stage prefix: Initial diagnosis Histologic grading system: 3 grade system   05/15/2021 Receptors her2   ADDITIONAL INFORMATION: 1. PROGNOSTIC INDICATORS Results: IMMUNOHISTOCHEMICAL AND MORPHOMETRIC ANALYSIS PERFORMED MANUALLY Estrogen Receptor: 30%, POSITIVE, WEAK STAINING INTENSITY Progesterone Receptor: 0%, NEGATIVE   05/16/2021 Initial Diagnosis   Ductal carcinoma in situ (DCIS) of left breast   05/20/2021 Imaging   MRI breast IMPRESSION: Non masslike enhancement in the anterior aspect of the left breast consistent with post biopsy change/hematoma. No additional abnormal enhancement in the left breast is identified. No abnormal enhancement in the right breast.   05/23/2021 Pathology Results   Diagnosis 1. Breast, right, needle core biopsy, upper outer posterior (x clip) - COLUMNAR CELL AND FIBROCYSTIC CHANGES WITH APOCRINE METAPLASIA AND CALCIFICATIONS - NO MALIGNANCY IDENTIFIED 2. Breast, right, needle core biopsy, upper outer anterior (ribbon clip) - COLUMNAR CELL AND FIBROCYSTIC CHANGES WITH APOCRINE METAPLASIA AND CALCIFICATIONS - PSEUDOANGIOMATOUS STROMAL HYPERPLASIA - NO MALIGNANCY IDENTIFIED   05/27/2021 Genetic Testing   Negative hereditary cancer genetic testing: no pathogenic variants detected in [facility] BRCAPlus Panel or CancerNext-Expanded +RNAinsight Panel.  The report dates are May 27, 2021 and  June 02, 2021, respectively.   The BRCAplus panel offered by [employer] and includes sequencing and deletion/duplication analysis for the following 8 genes: ATM, BRCA1, BRCA2, CDH1, CHEK2, PALB2, PTEN, and TP53.  The CancerNext-Expanded gene panel offered by [facility] and includes sequencing, rearrangement, and RNA analysis for the following 77 genes: AIP, ALK, APC, ATM, AXIN2, BAP1, BARD1, BLM, BMPR1A, BRCA1, BRCA2, BRIP1, CDC73, CDH1, CDK4, CDKN1B, CDKN2A, CHEK2, CTNNA1, DICER1, FANCC, FH, FLCN, GALNT12, KIF1B, LZTR1, MAX, MEN1, MET, MLH1, MSH2, MSH3, MSH6, MUTYH, NBN, NF1, NF2, NTHL1, PALB2, PHOX2B, PMS2, POT1, PRKAR1A, PTCH1, PTEN, RAD51C, RAD51D, RB1, RECQL, RET, SDHA, SDHAF2, SDHB, SDHC, SDHD, SMAD4, SMARCA4, SMARCB1, SMARCE1, STK11, SUFU, TMEM127, TP53, TSC1, TSC2, VHL and XRCC2 (sequencing and deletion/duplication); EGFR, EGLN1, HOXB13, KIT, MITF, PDGFRA, POLD1, and POLE (sequencing only); EPCAM and GREM1 (deletion/duplication only).    06/12/2021 Pathology Results   FINAL MICROSCOPIC DIAGNOSIS:   A. BREAST, LEFT, LUMPECTOMY:  - Ductal carcinoma in situ, 4.8 cm.  - Margins not involved.  - DCIS focally 0.1 cm from posterior margin and 0.5 cm from medial margin.  - Biopsy site and biopsy clip.  - See oncology table.    06/12/2021 Cancer Staging   Staging form: Breast, AJCC 8th Edition - Pathologic stage from 06/12/2021: Stage Unknown (pTis (DCIS), pNX, G3, ER+, PR-, HER2-) - Signed by Truitt Merle, MD on 08/16/2021 Stage prefix: Initial diagnosis Histologic grading system: 3 grade system Residual tumor (R): R0 - None   06/12/2021 Pathology Results   FINAL MICROSCOPIC DIAGNOSIS:   A. BREAST, LEFT, LUMPECTOMY:  - Ductal carcinoma in situ, 4.8 cm.  -  Margins not involved.  - DCIS focally 0.1 cm from posterior margin and 0.5 cm from medial margin.  - Biopsy site and biopsy clip.    07/18/2021 - 08/14/2021 Radiation Therapy   Left breast radiation, under Dr. Isidore Moos      INTERVAL  HISTORY:  Destiny Little is here for a follow up of DCIS. She was last seen by me on 05/22/21 in consultation. She presents to the clinic alone. She reports she tolerated radiation relatively well with skin peeling. She notes she is no longer using oxycodone. She reports she has been working with our Education officer, museum to apply for financial assistance and Medicaid.   All other systems were reviewed with the patient and are negative.  MEDICAL HISTORY:  Past Medical History:  Diagnosis Date   Anxiety    Breast cancer ([place])    History of kidney stones    Hypertension    Migraines    Pre-diabetes     SURGICAL HISTORY: Past Surgical History:  Procedure Laterality Date   ABDOMINAL HYSTERECTOMY     BREAST LUMPECTOMY WITH RADIOACTIVE SEED LOCALIZATION Left 06/12/2021   Procedure: LEFT BREAST LUMPECTOMY WITH RADIOACTIVE SEED LOCALIZATION X 2;  Surgeon: Stark Klein, MD;  Location: [facility];  Service: General;  Laterality: Left;  3 MINUTES ROOM 3   CESAREAN SECTION     x3   gastic bypass      I have reviewed the social history and family history with the patient and they are unchanged from previous note.  ALLERGIES:  has No Known Allergies.  MEDICATIONS:  Current Outpatient Medications  Medication Sig Dispense Refill   hyaluronate sodium (RADIAPLEXRX) GEL Apply 1 application topically 2 (two) times daily. 170 g 0   tamoxifen (NOLVADEX) 20 MG tablet Take 1 tablet (20 mg total) by mouth daily. 30 tablet 3   Cholecalciferol (VITAMIN D3) 1.25 MG (50000 UT) CAPS Take 1 capsule by mouth.     hydrOXYzine (VISTARIL) 25 MG capsule Take 25 mg by mouth daily as needed.     ibuprofen (ADVIL) 200 MG tablet Take 200 mg by mouth every 6 (six) hours as needed for moderate pain.     losartan-hydrochlorothiazide (HYZAAR) 50-12.5 MG tablet Take 1 tablet by mouth daily.     oxyCODONE (OXY IR/ROXICODONE) 5 MG immediate release tablet Take 1 tablet (5 mg total) by mouth every 6 (six) hours as  needed for severe pain. (Patient not taking: Reported on 07/09/2021) 10 tablet 0   potassium chloride (KLOR-CON) 10 MEQ tablet Take 10 mEq by mouth daily.     No current facility-administered medications for this visit.    PHYSICAL EXAMINATION: ECOG PERFORMANCE STATUS: 0 - Asymptomatic  Vitals:   08/16/21 1200  BP: 130/66  Pulse: 71  Resp: 17  Temp: 98.3 F (36.8 C)  SpO2: 100%   Wt Readings from Last 3 Encounters:  08/16/21 240 lb 11.2 oz (109.2 kg)  07/09/21 244 lb 2 oz (110.7 kg)  06/12/21 241 lb 10 oz (109.6 kg)     GENERAL:alert, no distress and comfortable SKIN: skin color normal, no rashes or significant lesions EYES: normal, Conjunctiva are pink and non-injected, sclera clear  NEURO: alert & oriented x 3 with fluent speech BREAST: (+) skin peeling and diffuse hyperpigmentation present in left breast, especially in the left axilla and underneath left breast  LABORATORY DATA:  I have reviewed the data as listed CBC Latest Ref Rng & Units 06/06/2021 05/22/2021  WBC 4.0 - 10.5 K/uL  8.0 6.9  Hemoglobin 12.0 - 15.0 g/dL 12.9 12.8  Hematocrit 36.0 - 46.0 % 40.5 37.8  Platelets 150 - 400 K/uL 466(H) 483(H)     CMP Latest Ref Rng & Units 06/06/2021 05/22/2021  Glucose 70 - 99 mg/dL 107(H) 140(H)  BUN 6 - 20 mg/dL 13 12  Creatinine 0.44 - 1.00 mg/dL 0.71 0.79  Sodium 135 - 145 mmol/L 137 139  Potassium 3.5 - 5.1 mmol/L 4.0 4.0  Chloride 98 - 111 mmol/L 103 104  CO2 22 - 32 mmol/L 26 24  Calcium 8.9 - 10.3 mg/dL 9.5 9.6  Total Protein 6.5 - 8.1 g/dL 7.1 7.6  Total Bilirubin 0.3 - 1.2 mg/dL 0.7 0.7  Alkaline Phos 38 - 126 U/L 55 64  AST 15 - 41 U/L 19 17  ALT 0 - 44 U/L 15 13      RADIOGRAPHIC STUDIES: I have personally reviewed the radiological images as listed and agreed with the findings in the report. No results found.    No orders of the defined types were placed in this encounter.  All questions were answered. The patient knows to call the clinic with any  problems, questions or concerns. No barriers to learning was detected. The total time spent in the appointment was 30 minutes.     Truitt Merle, MD 08/16/2021   I, Wilburn Mylar, am acting as scribe for Truitt Merle, MD.   I have reviewed the above documentation for accuracy and completeness, and I agree with the above.

## 2021-08-23 NOTE — Progress Notes (Signed)
                                                                                                                                                             Patient Name: Destiny Little MRN: [id number] DOB: 1971/02/24 Referring Physician: Truitt Merle (Profile Not Attached) Date of Service: 08/07/2021 [facility], [place]                                                        End Of Treatment Note  Diagnoses: D05.12-Intraductal carcinoma in situ of left breast  Cancer Staging: Cancer Staging Ductal carcinoma in situ (DCIS) of left breast Staging form: Breast, AJCC 8th Edition - Clinical stage from 05/15/2021: Stage 0 (cTis (DCIS), cN0, cM0, G3, ER+, PR-, HER2: Not Assessed) - Signed by Truitt Merle, MD on 05/21/2021 Stage prefix: Initial diagnosis Histologic grading system: 3 grade system - Pathologic stage from 06/12/2021: Stage Unknown (pTis (DCIS), pNX, G3, ER+, PR-, HER2-) - Signed by Truitt Merle, MD on 08/16/2021 Stage prefix: Initial diagnosis Histologic grading system: 3 grade system Residual tumor (R): R0 - None  Intent: Curative  Radiation Treatment Dates: 07/18/2021 through 08/14/2021 Site Technique Total Dose (Gy) Dose per Fx (Gy) Completed Fx Beam Energies  Breast, Left: Breast_Lt   Boost, Breast_Lt 3D    Photon isodose  40.05/40.05    10/10 2.67    2 15/15    5 10X    10X and 6X   Narrative: The patient tolerated radiation therapy relatively well.   Plan: The patient will follow-up with radiation oncology in 67mo or as needed -----------------------------------  Eppie Gibson, MD

## 2021-09-10 ENCOUNTER — Telehealth: Payer: Self-pay

## 2021-09-10 NOTE — Telephone Encounter (Signed)
I called the patient today and spoke with her via an interpreter about her upcoming follow-up appointment in radiation oncology.   Given the state of the COVID-19 pandemic, concerning case numbers in our community, and guidance from [facility], I offered a phone assessment with the patient to determine if coming to the clinic was necessary. She accepted.  Through the interpreter, the patient denies any symptomatic concerns. She denies any lingering fatigue or trouble sleeping at night. Denies any lingering soreness or pain to breast. Specifically, she reports good healing of her skin in the radiation fields.  Skin is intact and almost completely back to baseline. I recommended that she continue skin care by applying oil or lotion with vitamin E to the skin in the radiation fields, BID, for 2 more months.   Continue follow-up with medical oncology - follow-up is scheduled on 11/18/2021 with Lacie Burton-NP in the [facility].  I explained that yearly mammograms are important for patients with intact breast tissue, and physical exams are important after mastectomy for patients that cannot undergo mammography.  I encouraged her to call if she had further questions or concerns about her healing. Otherwise, she will follow-up PRN in radiation oncology. Patient is pleased with this plan, and we will cancel her upcoming follow-up to reduce the risk of COVID-19 transmission.

## 2021-09-11 ENCOUNTER — Ambulatory Visit: Payer: 59 | Admitting: Radiation Oncology

## 2021-10-22 ENCOUNTER — Other Ambulatory Visit: Payer: Self-pay | Admitting: Hematology

## 2021-10-23 NOTE — Telephone Encounter (Signed)
Tamoxifen refill ordered

## 2021-11-15 ENCOUNTER — Telehealth: Payer: Self-pay | Admitting: *Deleted

## 2021-11-17 NOTE — Progress Notes (Signed)
CLINIC:  Survivorship   Patient Care Team: Trey Sailors, [place] as PCP - General (Physician Assistant) Truitt Merle, MD as Consulting Physician (Hematology) Stark Klein, MD as Consulting Physician (General Surgery) Eppie Gibson, MD as Attending Physician (Radiation Oncology) Alla Feeling, NP as Nurse Practitioner (Nurse Practitioner) Harmon Pier, RN as Registered Nurse   REASON FOR VISIT:  Routine follow-up post-treatment for a recent history of breast cancer.  BRIEF ONCOLOGIC HISTORY:  Oncology History Overview Note  Cancer Staging Ductal carcinoma in situ (DCIS) of left breast Staging form: Breast, AJCC 8th Edition - Clinical stage from 05/15/2021: Stage 0 (cTis (DCIS), cN0, cM0, G3, ER+, PR-, HER2: Not Assessed) - Signed by Truitt Merle, MD on 05/21/2021 Stage prefix: Initial diagnosis Histologic grading system: 3 grade system - Pathologic stage from 06/12/2021: Stage Unknown (pTis (DCIS), pNX, G3, ER+, PR-, HER2-) - Signed by Truitt Merle, MD on 08/16/2021 Stage prefix: Initial diagnosis Histologic grading system: 3 grade system Residual tumor (R): R0 - None    Ductal carcinoma in situ (DCIS) of left breast  05/07/2021 Mammogram   IMPRESSION: 1. There is a 5 cm group of suspicious calcifications in the lower-inner quadrant of the left breast.   2. There are 2 small adjacent groups of calcifications in the upper-outer quadrant of the right breast measuring 0.6 and 0.5 cm respectively, which may represent fibrocystic changes.   05/15/2021 Initial Biopsy   Diagnosis 1. Breast, left, needle core biopsy, left breast LIQ anterior - DUCTAL CARCINOMA IN SITU WITH CALCIFICATIONS AND NECROSIS - SEE COMMENT 2. Breast, left, needle core biopsy, left breast LIQ posterior - DUCTAL CARCINOMA IN SITU WITH CALCIFICATIONS AND NECROSIS - SEE COMMENT Microscopic Comment 1. and 2. Based on the biopsy, the ductal carcinoma in situ has a comedo pattern, high nuclear grade and measures 0.2 cm  in greatest linear extent. Prognostic markers (ER/PR) are pending and will be reported in an addendum. Dr. Saralyn Pilar reviewed the case and agrees with the above diagnosis. These results were called to [facility] on May 16, 2021.   05/15/2021 Cancer Staging   Staging form: Breast, AJCC 8th Edition - Clinical stage from 05/15/2021: Stage 0 (cTis (DCIS), cN0, cM0, G3, ER+, PR-, HER2: Not Assessed) - Signed by Truitt Merle, MD on 05/21/2021 Stage prefix: Initial diagnosis Histologic grading system: 3 grade system    05/15/2021 Receptors her2   ADDITIONAL INFORMATION: 1. PROGNOSTIC INDICATORS Results: IMMUNOHISTOCHEMICAL AND MORPHOMETRIC ANALYSIS PERFORMED MANUALLY Estrogen Receptor: 30%, POSITIVE, WEAK STAINING INTENSITY Progesterone Receptor: 0%, NEGATIVE   05/16/2021 Initial Diagnosis   Ductal carcinoma in situ (DCIS) of left breast   05/20/2021 Imaging   MRI breast IMPRESSION: Non masslike enhancement in the anterior aspect of the left breast consistent with post biopsy change/hematoma. No additional abnormal enhancement in the left breast is identified. No abnormal enhancement in the right breast.   05/23/2021 Pathology Results   Diagnosis 1. Breast, right, needle core biopsy, upper outer posterior (x clip) - COLUMNAR CELL AND FIBROCYSTIC CHANGES WITH APOCRINE METAPLASIA AND CALCIFICATIONS - NO MALIGNANCY IDENTIFIED 2. Breast, right, needle core biopsy, upper outer anterior (ribbon clip) - COLUMNAR CELL AND FIBROCYSTIC CHANGES WITH APOCRINE METAPLASIA AND CALCIFICATIONS - PSEUDOANGIOMATOUS STROMAL HYPERPLASIA - NO MALIGNANCY IDENTIFIED   05/27/2021 Genetic Testing   Negative hereditary cancer genetic testing: no pathogenic variants detected in [facility] BRCAPlus Panel or CancerNext-Expanded +RNAinsight Panel.  The report dates are May 27, 2021 and June 02, 2021, respectively.   The BRCAplus panel offered  by [facility] and includes sequencing and deletion/duplication  analysis for the following 8 genes: ATM, BRCA1, BRCA2, CDH1, CHEK2, PALB2, PTEN, and TP53.  The CancerNext-Expanded gene panel offered by [facility] and includes sequencing, rearrangement, and RNA analysis for the following 77 genes: AIP, ALK, APC, ATM, AXIN2, BAP1, BARD1, BLM, BMPR1A, BRCA1, BRCA2, BRIP1, CDC73, CDH1, CDK4, CDKN1B, CDKN2A, CHEK2, CTNNA1, DICER1, FANCC, FH, FLCN, GALNT12, KIF1B, LZTR1, MAX, MEN1, MET, MLH1, MSH2, MSH3, MSH6, MUTYH, NBN, NF1, NF2, NTHL1, PALB2, PHOX2B, PMS2, POT1, PRKAR1A, PTCH1, PTEN, RAD51C, RAD51D, RB1, RECQL, RET, SDHA, SDHAF2, SDHB, SDHC, SDHD, SMAD4, SMARCA4, SMARCB1, SMARCE1, STK11, SUFU, TMEM127, TP53, TSC1, TSC2, VHL and XRCC2 (sequencing and deletion/duplication); EGFR, EGLN1, HOXB13, KIT, MITF, PDGFRA, POLD1, and POLE (sequencing only); EPCAM and GREM1 (deletion/duplication only).    06/12/2021 Pathology Results   FINAL MICROSCOPIC DIAGNOSIS:   A. BREAST, LEFT, LUMPECTOMY:  - Ductal carcinoma in situ, 4.8 cm.  - Margins not involved.  - DCIS focally 0.1 cm from posterior margin and 0.5 cm from medial margin.  - Biopsy site and biopsy clip.  - See oncology table.    06/12/2021 Cancer Staging   Staging form: Breast, AJCC 8th Edition - Pathologic stage from 06/12/2021: Stage Unknown (pTis (DCIS), pNX, G3, ER+, PR-, HER2-) - Signed by Truitt Merle, MD on 08/16/2021 Stage prefix: Initial diagnosis Histologic grading system: 3 grade system Residual tumor (R): R0 - None    06/12/2021 Pathology Results   FINAL MICROSCOPIC DIAGNOSIS:   A. BREAST, LEFT, LUMPECTOMY:  - Ductal carcinoma in situ, 4.8 cm.  - Margins not involved.  - DCIS focally 0.1 cm from posterior margin and 0.5 cm from medial margin.  - Biopsy site and biopsy clip.    06/12/2021 Surgery   Left lumpectomy   07/18/2021 - 08/14/2021 Radiation Therapy   Left breast radiation, under Dr. Isidore Moos   08/16/2021 - 08/16/2026 Anti-estrogen oral therapy   Tamoxifen to be taken daily for 5 years    11/18/2021 Survivorship   SCP delivered by Cira Rue, NP     INTERVAL HISTORY:  [name] presents to the [facility] today for our initial meeting to review her survivorship care plan detailing her treatment course for breast cancer, as well as monitoring long-term side effects of that treatment, education regarding health maintenance, screening, and overall wellness and health promotion.     Overall, [name] reports feeling well.  She continues tamoxifen.  She has mild hot flashes and some fatigue.  Denies bone or joint pain.  She has periodic bilateral breast pain.  She has intermittent pain in the right upper outer breast few days per month every month since her mammogram in 04/2021.  Denies new lump/mass, nipple discharge or inversion, or skin change.  She is losing weight intentionally cutting out sugar and lowering carbs.  She is very active at work and walks.  She is interested in a breast reduction as her large breasts cause back pain.  Otherwise denies bleeding, recent infection, abdominal pain or bloating.  She is up-to-date on vaccines.   ONCOLOGY TREATMENT TEAM:  1. Surgeon:  Dr[facility] Barry Dienes at [facility] 2. Medical Oncologist: Dr. Burr Medico 3. Radiation Oncologist: Dr. Isidore Moos    PAST MEDICAL/SURGICAL HISTORY:  Past Medical History:  Diagnosis Date   Anxiety    Breast cancer ([place])    History of kidney stones    Hypertension    Migraines    Pre-diabetes    Past Surgical History:  Procedure  Laterality Date   ABDOMINAL HYSTERECTOMY     BREAST LUMPECTOMY WITH RADIOACTIVE SEED LOCALIZATION Left 06/12/2021   Procedure: LEFT BREAST LUMPECTOMY WITH RADIOACTIVE SEED LOCALIZATION X 2;  Surgeon: Stark Klein, MD;  Location: [place];  Service: General;  Laterality: Left;  79 MINUTES ROOM 3   CESAREAN SECTION     x3   gastic bypass       ALLERGIES:  No Known Allergies   CURRENT MEDICATIONS:  Outpatient Encounter Medications as of  11/18/2021  Medication Sig   Cholecalciferol (VITAMIN D3) 1.25 MG (50000 UT) CAPS Take 1 capsule by mouth.   hyaluronate sodium (RADIAPLEXRX) GEL Apply 1 application topically 2 (two) times daily.   hydrOXYzine (VISTARIL) 25 MG capsule Take 25 mg by mouth daily as needed.   ibuprofen (ADVIL) 200 MG tablet Take 200 mg by mouth every 6 (six) hours as needed for moderate pain.   losartan-hydrochlorothiazide (HYZAAR) 50-12.5 MG tablet Take 1 tablet by mouth daily.   oxyCODONE (OXY IR/ROXICODONE) 5 MG immediate release tablet Take 1 tablet (5 mg total) by mouth every 6 (six) hours as needed for severe pain. (Patient not taking: Reported on 07/09/2021)   potassium chloride (KLOR-CON) 10 MEQ tablet Take 10 mEq by mouth daily.   tamoxifen (NOLVADEX) 20 MG tablet TAKE 1 TABLET BY MOUTH EVERY DAY   No facility-administered encounter medications on file as of 11/18/2021.     ONCOLOGIC FAMILY HISTORY:  Family History  Problem Relation Age of Onset   Diabetes Father    Hypertension Father      GENETIC COUNSELING/TESTING: Yes, negative  SOCIAL HISTORY:  Rashaun Curl is single and lives with family.  She is here today with one of her daughters who is translating.  They declined remote [employer] interpreter. 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  She denies any current or history of tobacco, alcohol, or illicit drug use.     PHYSICAL EXAMINATION:  Vital Signs:   Vitals:   11/18/21 1259  BP: 119/64  Pulse: 81  Resp: 20  Temp: 97.9 F (36.6 C)  SpO2: 100%   Filed Weights   11/18/21 1259  Weight: 227 lb 14.4 oz (103.4 kg)   General: Well-nourished, well-appearing female in no acute distress.   HEENT: Sclerae anicteric.  Lymph: No cervical, supraclavicular, or infraclavicular lymphadenopathy noted on palpation.  Cardiovascular: Regular rate and rhythm Respiratory: Clear; breathing non-labored.  GI: Abdomen soft and round; non-tender, non-distended. Bowel  sounds normoactive.  Neuro: No focal deficits. Steady gait.  Psych: Mood and affect normal and appropriate for situation.  Extremities: No edema. MSK: No focal spinal tenderness to palpation.  Full range of motion in bilateral upper extremities Skin: Warm and dry. Breast: Breasts are pendulous without bilateral nipple discharge or inversion.  S/p left lumpectomy and radiation, incisions completely healed.  Left breast with mild diffuse hyperpigmentation and lymphedema in the lower inner quadrant.  Point tenderness in the right outer lateral breast/chest wall without palpable mass or abnormality in either breast or axilla that I could appreciate.  LABORATORY DATA:  None for this visit.  DIAGNOSTIC IMAGING:  None for this visit.      ASSESSMENT AND PLAN:  Ms.. Destiny Little is a pleasant 50 y.o. 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  She presents to the Survivorship Clinic for our initial meeting  and routine follow-up post-completion of treatment for breast cancer.    1. Stage 0 Left breast DCIS:  [name] is continuing to recover from definitive treatment for breast cancer. She will follow-up with her medical oncologist, Dr. Burr Medico in 02/2022 with history and physical exam per surveillance protocol.  She will continue her anti-estrogen therapy with tamoxifen. Thus far, she is tolerating well, with mild hot flashes and fatigue. She was instructed to make Dr. Burr Medico or myself aware if she begins to experience any worsening side effects of the medication and I could see her back in clinic to help manage those side effects, as needed. [name] was encouraged to contact Dr. Burr Medico or myself with any vaginal bleeding while taking Tamoxifen. Other side effects of Tamoxifen were again reviewed with her as well. Today,  a comprehensive survivorship care plan and treatment summary was reviewed with the patient today detailing her breast cancer diagnosis, treatment course, potential late/long-term effects of treatment, appropriate follow-up care with recommendations for the future, and patient education resources.  A copy of this summary, along with a letter will be sent to the patient's primary care provider via mail/fax/In Basket message after today's visit.    2. Right breast pain - she has intermittent R breast pain at the outer lateral breast/chest wall since mammogram in 04/2021. She is tender on exam without palpable mass. Biopsy 05/2021 confirmed calcifications. We also reviewed etiologies of benign breast pain. Will refer for R diag mammo to establish stability of the calcifications. She is also requesting referral to plastic surgery for breast reduction, she has back pain. I referred her.   3. Bone health:  Given [name]'s age/history of breast cancer, pre-menopausal status, and her current treatment regimen including anti-estrogen therapy with tamoxifen which has a bone strengthening quality, she does not need to begin DEXA screening at this time.  We will start when she becomes postmenopausal and/or switches to AI.  In the meantime, she was encouraged to increase her consumption of foods rich in calcium, as well as increase her weight-bearing activities.  She was given education on specific activities to promote bone health.  3. Cancer screening:  Due to [name]'s history and her age, she should receive screening for skin cancers, colon cancer, and gynecologic cancers (if she still has cervix). I referred her to GI for routine colonoscopy. Information and recommendations are listed on the patient's comprehensive care plan/treatment summary and were reviewed in detail with the patient.    4. . Health maintenance and wellness promotion: [name] was encouraged to consume 5-7  servings of fruits and vegetables per day. We reviewed the "Nutrition Rainbow" handout. She was also encouraged to engage in moderate to vigorous exercise for 30 minutes per day most days of the week. She was instructed to limit her alcohol consumption and continue to abstain from tobacco use.   5. Support services/counseling: It is not uncommon for this period of the patient's cancer care trajectory to be one of many emotions and stressors.  We discussed an opportunity for her to participate in the next session of [facility] ("Finding Your New Normal") support group series designed for patients after they have completed treatment.   [name] was encouraged to take advantage of our many other support services programs, support groups, and/or counseling in coping with her new life as a cancer survivor after completing anti-cancer treatment.  She was offered support today  through active listening and expressive supportive counseling.  She was given information regarding our available services and encouraged to contact me with any questions or for help enrolling in any of our support group/programs.    Dispo:   -Return to cancer center 02/2022  -R diagnostic mammo for breast pain in 1-2 weeks, then Bilateral mammo due 04/2022 -referrals to plastic surgery (breast augmentation/reduction) and GI for routine colonoscopy  -Follow up with surgery as scheduled -She is welcome to return back to the Survivorship Clinic at any time; no additional follow-up needed at this time.  -Consider referral back to survivorship as a long-term survivor for continued surveillance  Orders Placed This Encounter  Procedures   MM DIAG BREAST TOMO UNI RIGHT    Standing Status:   Future    Standing Expiration Date:   11/18/2022    Order Specific Question:   Reason for Exam (SYMPTOM  OR DIAGNOSIS REQUIRED)    Answer:   R breast calcifications, breast pain. left breast DCIS 04/2021    Order Specific Question:   Is the  patient pregnant?    Answer:   No    Order Specific Question:   Preferred imaging location?    Answer:   [facility]   MM DIAG BREAST TOMO BILATERAL    Standing Status:   Future    Standing Expiration Date:   11/18/2022    Order Specific Question:   Reason for Exam (SYMPTOM  OR DIAGNOSIS REQUIRED)    Answer:   L breast DCIS 04/2021 s/p lumpectomy, RT, on tamoxifen    Order Specific Question:   Is the patient pregnant?    Answer:   No    Order Specific Question:   Preferred imaging location?    Answer:   [facility]     A total of (30) minutes of face-to-face time was spent with this patient with greater than 50% of that time in counseling and care-coordination.   Cira Rue, NP Survivorship Program [facility] [phone]   Note: PRIMARY CARE PROVIDER Trey Sailors, [place] [phone]

## 2021-11-18 ENCOUNTER — Inpatient Hospital Stay: Payer: 59 | Attending: Hematology | Admitting: Nurse Practitioner

## 2021-11-18 ENCOUNTER — Encounter: Payer: Self-pay | Admitting: Nurse Practitioner

## 2021-11-18 ENCOUNTER — Other Ambulatory Visit: Payer: Self-pay

## 2021-11-18 VITALS — BP 119/64 | HR 81 | Temp 97.9°F | Resp 20 | Ht 63.0 in | Wt 227.9 lb

## 2021-11-18 DIAGNOSIS — N644 Mastodynia: Secondary | ICD-10-CM

## 2021-11-18 DIAGNOSIS — Z170 Estrogen receptor positive status [ER+]: Secondary | ICD-10-CM | POA: Diagnosis not present

## 2021-11-18 DIAGNOSIS — D0512 Intraductal carcinoma in situ of left breast: Secondary | ICD-10-CM | POA: Diagnosis present

## 2021-11-18 DIAGNOSIS — Z79810 Long term (current) use of selective estrogen receptor modulators (SERMs): Secondary | ICD-10-CM | POA: Diagnosis not present

## 2021-11-19 ENCOUNTER — Other Ambulatory Visit: Payer: Self-pay | Admitting: Nurse Practitioner

## 2021-11-19 DIAGNOSIS — N644 Mastodynia: Secondary | ICD-10-CM

## 2021-11-25 ENCOUNTER — Other Ambulatory Visit: Payer: Self-pay

## 2021-11-25 MED ORDER — TAMOXIFEN CITRATE 20 MG PO TABS: 20.0000 mg | ORAL_TABLET | Freq: Every day | ORAL | 1 refills | Status: DC

## 2021-11-25 NOTE — Progress Notes (Signed)
Refilled Tomoxifen per pt's request for refill.

## 2021-11-27 ENCOUNTER — Other Ambulatory Visit: Payer: Self-pay | Admitting: Nurse Practitioner

## 2021-11-27 DIAGNOSIS — N644 Mastodynia: Secondary | ICD-10-CM

## 2021-11-29 ENCOUNTER — Ambulatory Visit
Admission: RE | Admit: 2021-11-29 | Discharge: 2021-11-29 | Disposition: A | Discharge status: Home / Self Care | Payer: 59 | Source: Ambulatory Visit | Attending: Nurse Practitioner | Admitting: Nurse Practitioner

## 2021-11-29 DIAGNOSIS — N644 Mastodynia: Secondary | ICD-10-CM

## 2021-12-20 ENCOUNTER — Other Ambulatory Visit: Payer: 59

## 2022-01-29 ENCOUNTER — Telehealth: Payer: Self-pay | Admitting: Hematology

## 2022-01-29 NOTE — Telephone Encounter (Signed)
Rescheduled upcoming appointment due to provider's breast clinic. Patient is aware of changes. 

## 2022-02-06 ENCOUNTER — Other Ambulatory Visit: Payer: Self-pay

## 2022-02-06 DIAGNOSIS — D0512 Intraductal carcinoma in situ of left breast: Secondary | ICD-10-CM

## 2022-02-07 ENCOUNTER — Inpatient Hospital Stay: Payer: BC Managed Care – PPO | Attending: Hematology

## 2022-02-07 ENCOUNTER — Encounter: Payer: Self-pay | Admitting: Hematology

## 2022-02-07 ENCOUNTER — Other Ambulatory Visit: Payer: Self-pay

## 2022-02-07 ENCOUNTER — Inpatient Hospital Stay (HOSPITAL_BASED_OUTPATIENT_CLINIC_OR_DEPARTMENT_OTHER): Payer: BC Managed Care – PPO | Admitting: Hematology

## 2022-02-07 VITALS — BP 136/68 | HR 74 | Temp 98.6°F | Resp 18 | Wt 227.6 lb

## 2022-02-07 DIAGNOSIS — Z79899 Other long term (current) drug therapy: Secondary | ICD-10-CM | POA: Diagnosis not present

## 2022-02-07 DIAGNOSIS — D0512 Intraductal carcinoma in situ of left breast: Secondary | ICD-10-CM | POA: Diagnosis present

## 2022-02-07 DIAGNOSIS — Z79810 Long term (current) use of selective estrogen receptor modulators (SERMs): Secondary | ICD-10-CM | POA: Diagnosis not present

## 2022-02-07 DIAGNOSIS — Z923 Personal history of irradiation: Secondary | ICD-10-CM | POA: Diagnosis not present

## 2022-02-07 LAB — CBC WITH DIFFERENTIAL (CANCER CENTER ONLY)
Abs Immature Granulocytes: 0.01 10*3/uL (ref 0.00–0.07)
Basophils Absolute: 0 10*3/uL (ref 0.0–0.1)
Basophils Relative: 0 %
Eosinophils Absolute: 0.1 10*3/uL (ref 0.0–0.5)
Eosinophils Relative: 1 %
HCT: 36.6 % (ref 36.0–46.0)
Hemoglobin: 12.2 g/dL (ref 12.0–15.0)
Immature Granulocytes: 0 %
Lymphocytes Relative: 37 %
Lymphs Abs: 2.6 10*3/uL (ref 0.7–4.0)
MCH: 31.3 pg (ref 26.0–34.0)
MCHC: 33.3 g/dL (ref 30.0–36.0)
MCV: 93.8 fL (ref 80.0–100.0)
Monocytes Absolute: 0.4 10*3/uL (ref 0.1–1.0)
Monocytes Relative: 6 %
Neutro Abs: 4 10*3/uL (ref 1.7–7.7)
Neutrophils Relative %: 56 %
Platelet Count: 379 10*3/uL (ref 150–400)
RBC: 3.9 MIL/uL (ref 3.87–5.11)
RDW: 13 % (ref 11.5–15.5)
WBC Count: 7.2 10*3/uL (ref 4.0–10.5)
nRBC: 0 % (ref 0.0–0.2)

## 2022-02-07 LAB — CMP (CANCER CENTER ONLY)
ALT: 13 U/L (ref 0–44)
AST: 17 U/L (ref 15–41)
Albumin: 3.9 g/dL (ref 3.5–5.0)
Alkaline Phosphatase: 55 U/L (ref 38–126)
Anion gap: 6 (ref 5–15)
BUN: 23 mg/dL — ABNORMAL HIGH (ref 6–20)
CO2: 28 mmol/L (ref 22–32)
Calcium: 9 mg/dL (ref 8.9–10.3)
Chloride: 105 mmol/L (ref 98–111)
Creatinine: 0.7 mg/dL (ref 0.44–1.00)
GFR, Estimated: >60 mL/min (ref >60)
Glucose, Bld: 104 mg/dL — ABNORMAL HIGH (ref 70–99)
Potassium: 3.7 mmol/L (ref 3.5–5.1)
Sodium: 139 mmol/L (ref 135–145)
Total Bilirubin: 0.3 mg/dL (ref 0.3–1.2)
Total Protein: 6.9 g/dL (ref 6.5–8.1)

## 2022-02-07 MED ORDER — TAMOXIFEN CITRATE 20 MG PO TABS: 20.0000 mg | ORAL_TABLET | Freq: Every day | ORAL | 1 refills | Status: DC

## 2022-02-07 NOTE — Progress Notes (Signed)
[place]   [phone]) [phone] [phone]) [phone]   Clinic Follow up Note   Patient Care Team: Trey Sailors, PA as PCP - General (Physician Assistant) Truitt Merle, MD as Consulting Physician (Hematology) Stark Klein, MD as Consulting Physician (General Surgery) Eppie Gibson, MD as Attending Physician (Radiation Oncology) Alla Feeling, NP as Nurse Practitioner (Nurse Practitioner) Harmon Pier, RN as Registered Nurse  Date of Service:  02/07/2022  CHIEF COMPLAINT: f/u of left breast DCIS  CURRENT THERAPY:  Tamoxifen, started 08/2021.  ASSESSMENT & PLAN:  Destiny Little is a 51 y.o. female with   1. Left breast DCIS, grade III, ER30%+/PR-  -calcium deposits were seen on screening mammogram. Biopsy 05/15/21 showed high grade DCIS, ER 30%+. Additional right breast biopsies on 05/23/21 were benign. -she underwent left lumpectomy on 06/12/21 with Dr. Barry Dienes. Pathology again showed high grade DCIS, 4.8 cm. Margins not involved. -She received adjuvant radiation under Dr. Isidore Moos from 07/18/21 - 08/14/21. -she started tamoxifen around late September/early October 2022. She is tolerating well overall. -she is clinically doing well, though she notes new left leg numbness. Labs reviewed, no concern. Physical exam was unremarkable. There is no clinical concern for recurrence. -We also reviewed the breast cancer surveillance, including annual mammogram, and physical exam. She will be due for mammogram in 04/2022.   2. Genetics  -Given her age, she is eligible for genetic testing. -Testing performed 05/22/21. Results were negative.   3. Social and Financial Support -she reports she is single with three daughters. She lives with one of them and her granddaughter. -she previously applied for grants. She requests referral to speak to someone regarding her bills. I will reach out to our billing department.     PLAN:  -continue tamoxifen -mammogram due  04/2022 -labs and f/u in 6 months   No problem-specific Assessment & Plan notes found for this encounter.   SUMMARY OF ONCOLOGIC HISTORY: Oncology History Overview Note  Cancer Staging Ductal carcinoma in situ (DCIS) of left breast Staging form: Breast, AJCC 8th Edition - Clinical stage from 05/15/2021: Stage 0 (cTis (DCIS), cN0, cM0, G3, ER+, PR-, HER2: Not Assessed) - Signed by Truitt Merle, MD on 05/21/2021 Stage prefix: Initial diagnosis Histologic grading system: 3 grade system - Pathologic stage from 06/12/2021: Stage Unknown (pTis (DCIS), pNX, G3, ER+, PR-, HER2-) - Signed by Truitt Merle, MD on 08/16/2021 Stage prefix: Initial diagnosis Histologic grading system: 3 grade system Residual tumor (R): R0 - None    Ductal carcinoma in situ (DCIS) of left breast  05/07/2021 Mammogram   IMPRESSION: 1. There is a 5 cm group of suspicious calcifications in the lower-inner quadrant of the left breast.   2. There are 2 small adjacent groups of calcifications in the upper-outer quadrant of the right breast measuring 0.6 and 0.5 cm respectively, which may represent fibrocystic changes.   05/15/2021 Initial Biopsy   Diagnosis 1. Breast, left, needle core biopsy, left breast LIQ anterior - DUCTAL CARCINOMA IN SITU WITH CALCIFICATIONS AND NECROSIS - SEE COMMENT 2. Breast, left, needle core biopsy, left breast LIQ posterior - DUCTAL CARCINOMA IN SITU WITH CALCIFICATIONS AND NECROSIS - SEE COMMENT Microscopic Comment 1. and 2. Based on the biopsy, the ductal carcinoma in situ has a comedo pattern, high nuclear grade and measures 0.2 cm in greatest linear extent. Prognostic markers (ER/PR) are pending and will be reported in an addendum. Dr. Saralyn Pilar reviewed the case and agrees with the above diagnosis. These results  were called to [facility] on May 16, 2021.   05/15/2021 Cancer Staging   Staging form: Breast, AJCC 8th Edition - Clinical stage from 05/15/2021: Stage 0 (cTis  (DCIS), cN0, cM0, G3, ER+, PR-, HER2: Not Assessed) - Signed by Truitt Merle, MD on 05/21/2021 Stage prefix: Initial diagnosis Histologic grading system: 3 grade system    05/15/2021 Receptors her2   ADDITIONAL INFORMATION: 1. PROGNOSTIC INDICATORS Results: IMMUNOHISTOCHEMICAL AND MORPHOMETRIC ANALYSIS PERFORMED MANUALLY Estrogen Receptor: 30%, POSITIVE, WEAK STAINING INTENSITY Progesterone Receptor: 0%, NEGATIVE   05/16/2021 Initial Diagnosis   Ductal carcinoma in situ (DCIS) of left breast   05/20/2021 Imaging   MRI breast IMPRESSION: Non masslike enhancement in the anterior aspect of the left breast consistent with post biopsy change/hematoma. No additional abnormal enhancement in the left breast is identified. No abnormal enhancement in the right breast.   05/23/2021 Pathology Results   Diagnosis 1. Breast, right, needle core biopsy, upper outer posterior (x clip) - COLUMNAR CELL AND FIBROCYSTIC CHANGES WITH APOCRINE METAPLASIA AND CALCIFICATIONS - NO MALIGNANCY IDENTIFIED 2. Breast, right, needle core biopsy, upper outer anterior (ribbon clip) - COLUMNAR CELL AND FIBROCYSTIC CHANGES WITH APOCRINE METAPLASIA AND CALCIFICATIONS - PSEUDOANGIOMATOUS STROMAL HYPERPLASIA - NO MALIGNANCY IDENTIFIED   05/27/2021 Genetic Testing   Negative hereditary cancer genetic testing: no pathogenic variants detected in [facility] BRCAPlus Panel or CancerNext-Expanded +RNAinsight Panel.  The report dates are May 27, 2021 and June 02, 2021, respectively.   The BRCAplus panel offered by [employer] and includes sequencing and deletion/duplication analysis for the following 8 genes: ATM, BRCA1, BRCA2, CDH1, CHEK2, PALB2, PTEN, and TP53.  The CancerNext-Expanded gene panel offered by [facility] and includes sequencing, rearrangement, and RNA analysis for the following 77 genes: AIP, ALK, APC, ATM, AXIN2, BAP1, BARD1, BLM, BMPR1A, BRCA1, BRCA2, BRIP1, CDC73, CDH1, CDK4, CDKN1B, CDKN2A, CHEK2, CTNNA1, DICER1,  FANCC, FH, FLCN, GALNT12, KIF1B, LZTR1, MAX, MEN1, MET, MLH1, MSH2, MSH3, MSH6, MUTYH, NBN, NF1, NF2, NTHL1, PALB2, PHOX2B, PMS2, POT1, PRKAR1A, PTCH1, PTEN, RAD51C, RAD51D, RB1, RECQL, RET, SDHA, SDHAF2, SDHB, SDHC, SDHD, SMAD4, SMARCA4, SMARCB1, SMARCE1, STK11, SUFU, TMEM127, TP53, TSC1, TSC2, VHL and XRCC2 (sequencing and deletion/duplication); EGFR, EGLN1, HOXB13, KIT, MITF, PDGFRA, POLD1, and POLE (sequencing only); EPCAM and GREM1 (deletion/duplication only).    06/12/2021 Pathology Results   FINAL MICROSCOPIC DIAGNOSIS:   A. BREAST, LEFT, LUMPECTOMY:  - Ductal carcinoma in situ, 4.8 cm.  - Margins not involved.  - DCIS focally 0.1 cm from posterior margin and 0.5 cm from medial margin.  - Biopsy site and biopsy clip.  - See oncology table.    06/12/2021 Cancer Staging   Staging form: Breast, AJCC 8th Edition - Pathologic stage from 06/12/2021: Stage Unknown (pTis (DCIS), pNX, G3, ER+, PR-, HER2-) - Signed by Truitt Merle, MD on 08/16/2021 Stage prefix: Initial diagnosis Histologic grading system: 3 grade system Residual tumor (R): R0 - None    06/12/2021 Pathology Results   FINAL MICROSCOPIC DIAGNOSIS:   A. BREAST, LEFT, LUMPECTOMY:  - Ductal carcinoma in situ, 4.8 cm.  - Margins not involved.  - DCIS focally 0.1 cm from posterior margin and 0.5 cm from medial margin.  - Biopsy site and biopsy clip.    06/12/2021 Surgery   Left lumpectomy   07/18/2021 - 08/14/2021 Radiation Therapy   Left breast radiation, under Dr. Isidore Moos   08/16/2021 - 08/16/2026 Anti-estrogen oral therapy   Tamoxifen to be taken daily for 5 years   11/18/2021 Survivorship   SCP delivered by  Cira Rue, NP      INTERVAL HISTORY:  Destiny Little is here for a follow up of DCIS. She was last seen by me on 08/16/22 with survivorship in the interim. She presents to the clinic alone. She reports she is doing well overall, but she reports new numbness to her left leg. She denies any difficulty walking.    All other systems were reviewed with the patient and are negative.  MEDICAL HISTORY:  Past Medical History:  Diagnosis Date   Anxiety    Breast cancer ([place])    History of kidney stones    Hypertension    Migraines    Pre-diabetes     SURGICAL HISTORY: Past Surgical History:  Procedure Laterality Date   ABDOMINAL HYSTERECTOMY     BREAST LUMPECTOMY WITH RADIOACTIVE SEED LOCALIZATION Left 06/12/2021   Procedure: LEFT BREAST LUMPECTOMY WITH RADIOACTIVE SEED LOCALIZATION X 2;  Surgeon: Stark Klein, MD;  Location: [facility];  Service: General;  Laterality: Left;  42 MINUTES ROOM 3   CESAREAN SECTION     x3   gastic bypass      I have reviewed the social history and family history with the patient and they are unchanged from previous note.  ALLERGIES:  has No Known Allergies.  MEDICATIONS:  Current Outpatient Medications  Medication Sig Dispense Refill   Cholecalciferol (VITAMIN D3) 1.25 MG (50000 UT) CAPS Take 1 capsule by mouth.     hyaluronate sodium (RADIAPLEXRX) GEL Apply 1 application topically 2 (two) times daily. 170 g 0   hydrOXYzine (VISTARIL) 25 MG capsule Take 25 mg by mouth daily as needed.     ibuprofen (ADVIL) 200 MG tablet Take 200 mg by mouth every 6 (six) hours as needed for moderate pain.     losartan-hydrochlorothiazide (HYZAAR) 50-12.5 MG tablet Take 1 tablet by mouth daily.     oxyCODONE (OXY IR/ROXICODONE) 5 MG immediate release tablet Take 1 tablet (5 mg total) by mouth every 6 (six) hours as needed for severe pain. (Patient not taking: Reported on 07/09/2021) 10 tablet 0   potassium chloride (KLOR-CON) 10 MEQ tablet Take 10 mEq by mouth daily.     tamoxifen (NOLVADEX) 20 MG tablet Take 1 tablet (20 mg total) by mouth daily. 90 tablet 1   No current facility-administered medications for this visit.    PHYSICAL EXAMINATION: ECOG PERFORMANCE STATUS: 0 - Asymptomatic  Vitals:   02/07/22 1510  BP: 136/68  Pulse: 74  Resp: 18  Temp: 98.6 F (37 C)  SpO2:  100%   Wt Readings from Last 3 Encounters:  02/07/22 227 lb 9 oz (103.2 kg)  11/18/21 227 lb 14.4 oz (103.4 kg)  08/16/21 240 lb 11.2 oz (109.2 kg)     GENERAL:alert, no distress and comfortable SKIN: skin color, texture, turgor are normal, no rashes or significant lesions EYES: normal, Conjunctiva are pink and non-injected, sclera clear  NECK: supple, thyroid normal size, non-tender, without nodularity LYMPH:  no palpable lymphadenopathy in the cervical, axillary  LUNGS: clear to auscultation and percussion with normal breathing effort HEART: regular rate & rhythm and no murmurs and no lower extremity edema ABDOMEN:abdomen soft, non-tender and normal bowel sounds Musculoskeletal:no cyanosis of digits and no clubbing  NEURO: alert & oriented x 3 with fluent speech, no focal motor/sensory deficits  LABORATORY DATA:  I have reviewed the data as listed CBC Latest Ref Rng & Units 02/07/2022 06/06/2021 05/22/2021  WBC 4.0 - 10.5 K/uL 7.2 8.0 6.9  Hemoglobin  12.0 - 15.0 g/dL 12.2 12.9 12.8  Hematocrit 36.0 - 46.0 % 36.6 40.5 37.8  Platelets 150 - 400 K/uL 379 466(H) 483(H)     CMP Latest Ref Rng & Units 02/07/2022 06/06/2021 05/22/2021  Glucose 70 - 99 mg/dL 104(H) 107(H) 140(H)  BUN 6 - 20 mg/dL 23(H) 13 12  Creatinine 0.44 - 1.00 mg/dL 0.70 0.71 0.79  Sodium 135 - 145 mmol/L 139 137 139  Potassium 3.5 - 5.1 mmol/L 3.7 4.0 4.0  Chloride 98 - 111 mmol/L 105 103 104  CO2 22 - 32 mmol/L '28 26 24  ' Calcium 8.9 - 10.3 mg/dL 9.0 9.5 9.6  Total Protein 6.5 - 8.1 g/dL 6.9 7.1 7.6  Total Bilirubin 0.3 - 1.2 mg/dL 0.3 0.7 0.7  Alkaline Phos 38 - 126 U/L 55 55 64  AST 15 - 41 U/L '17 19 17  ' ALT 0 - 44 U/L '13 15 13      ' RADIOGRAPHIC STUDIES: I have personally reviewed the radiological images as listed and agreed with the findings in the report. No results found.    No orders of the defined types were placed in this encounter.  All questions were answered. The patient knows to call the  clinic with any problems, questions or concerns. No barriers to learning was detected. The total time spent in the appointment was 25 minutes.     Truitt Merle, MD 02/07/2022   I, Wilburn Mylar, am acting as scribe for Truitt Merle, MD.   I have reviewed the above documentation for accuracy and completeness, and I agree with the above.

## 2022-02-12 ENCOUNTER — Inpatient Hospital Stay: Payer: BC Managed Care – PPO | Admitting: Hematology

## 2022-02-12 ENCOUNTER — Inpatient Hospital Stay: Payer: BC Managed Care – PPO

## 2022-03-25 ENCOUNTER — Encounter (HOSPITAL_COMMUNITY): Payer: Self-pay

## 2022-04-16 ENCOUNTER — Ambulatory Visit
Admission: RE | Admit: 2022-04-16 | Discharge: 2022-04-16 | Disposition: A | Discharge status: Home / Self Care | Payer: BC Managed Care – PPO | Source: Ambulatory Visit | Attending: Hematology | Admitting: Hematology

## 2022-04-16 DIAGNOSIS — D0512 Intraductal carcinoma in situ of left breast: Secondary | ICD-10-CM

## 2022-06-07 ENCOUNTER — Other Ambulatory Visit: Payer: Self-pay

## 2022-06-07 DIAGNOSIS — I8393 Asymptomatic varicose veins of bilateral lower extremities: Secondary | ICD-10-CM

## 2022-06-26 ENCOUNTER — Encounter (HOSPITAL_COMMUNITY): Payer: BC Managed Care – PPO

## 2022-06-26 IMAGING — MG MM PLC BREAST LOC DEV 1ST LESION INC MAMMO GUIDE*L*
6 series · 6 of 6 positions shown · non-contrast
Comparison: Previous exam(s).

CLINICAL DATA: Recently diagnosed high-grade ductal carcinoma in
situ on 2 separate stereotactic guided core needle biopsies in the
lower inner quadrant of the left breast anteriorly. The more
posterior X shaped clip is located in satisfactory position and the
more anteriorly located ribbon shaped clip is located 1.5 cm lateral
to the biopsied calcifications on post clip mammogram images dated
05/15/2021.

EXAM:
MAMMOGRAPHIC GUIDED RADIOACTIVE SEED LOCALIZATION OF THE LEFT BREAST
X 2

[L ML (1 of 3)]
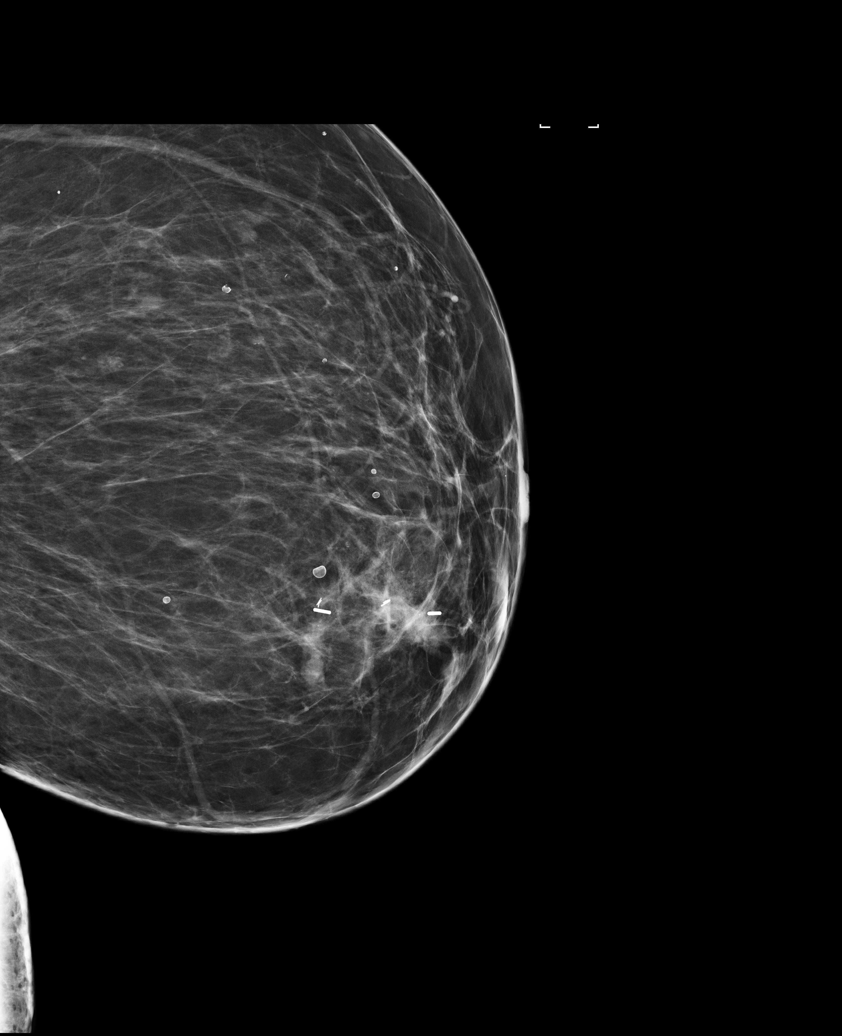

[L CC (1 of 3)]
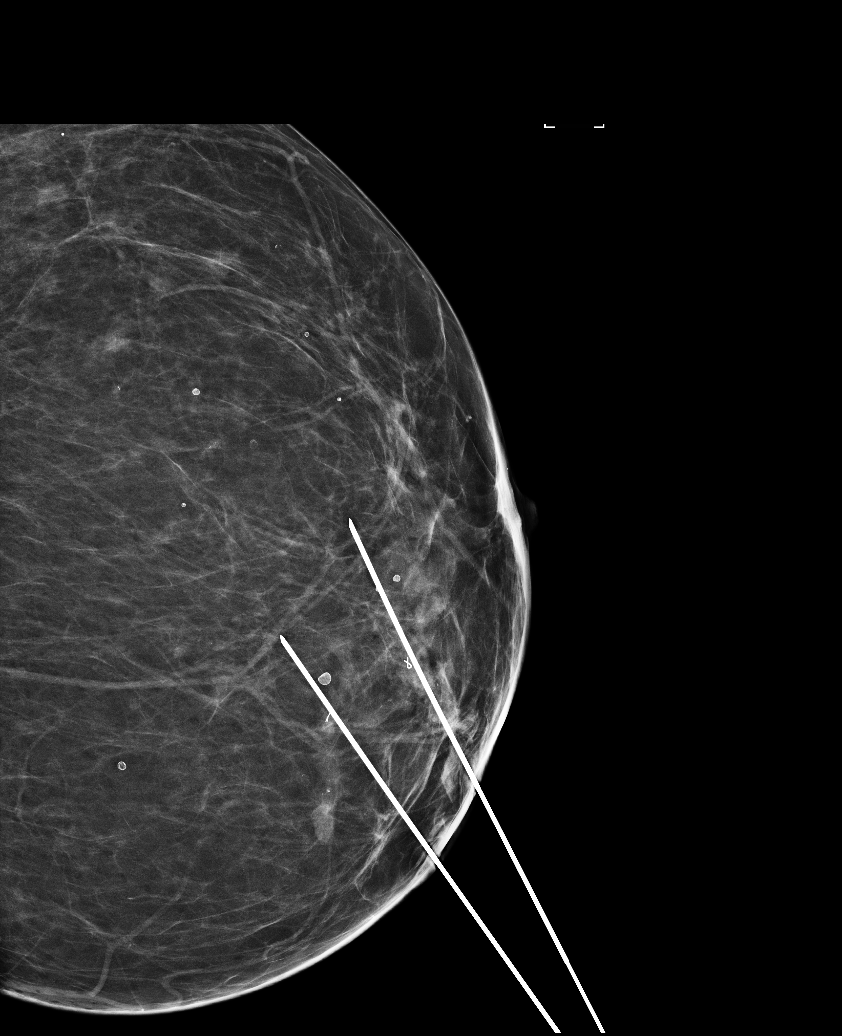

[L CC (2 of 3)]
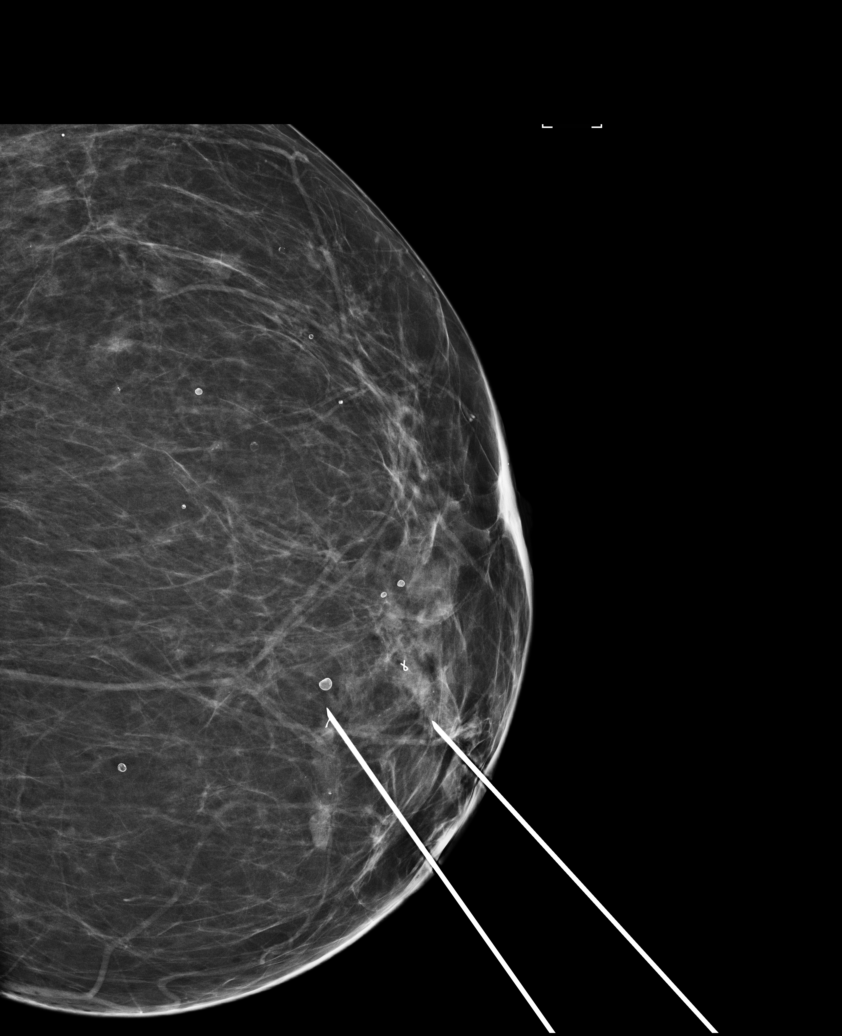

[L ML (2 of 3)]
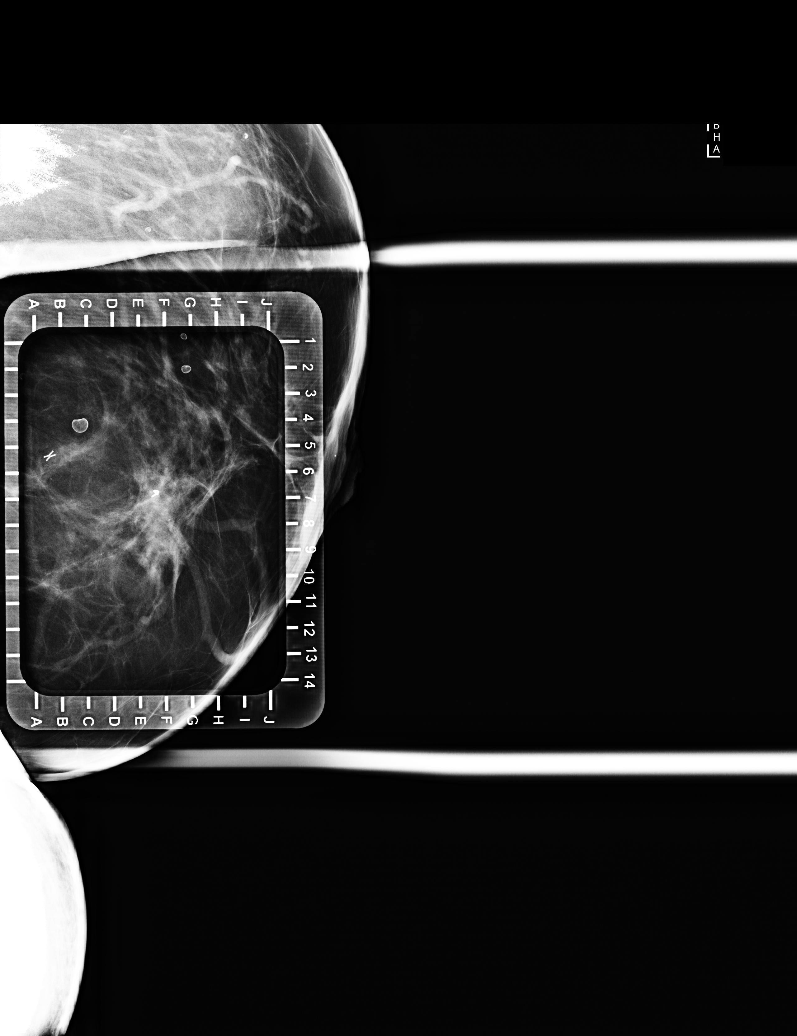

[L CC (3 of 3)]
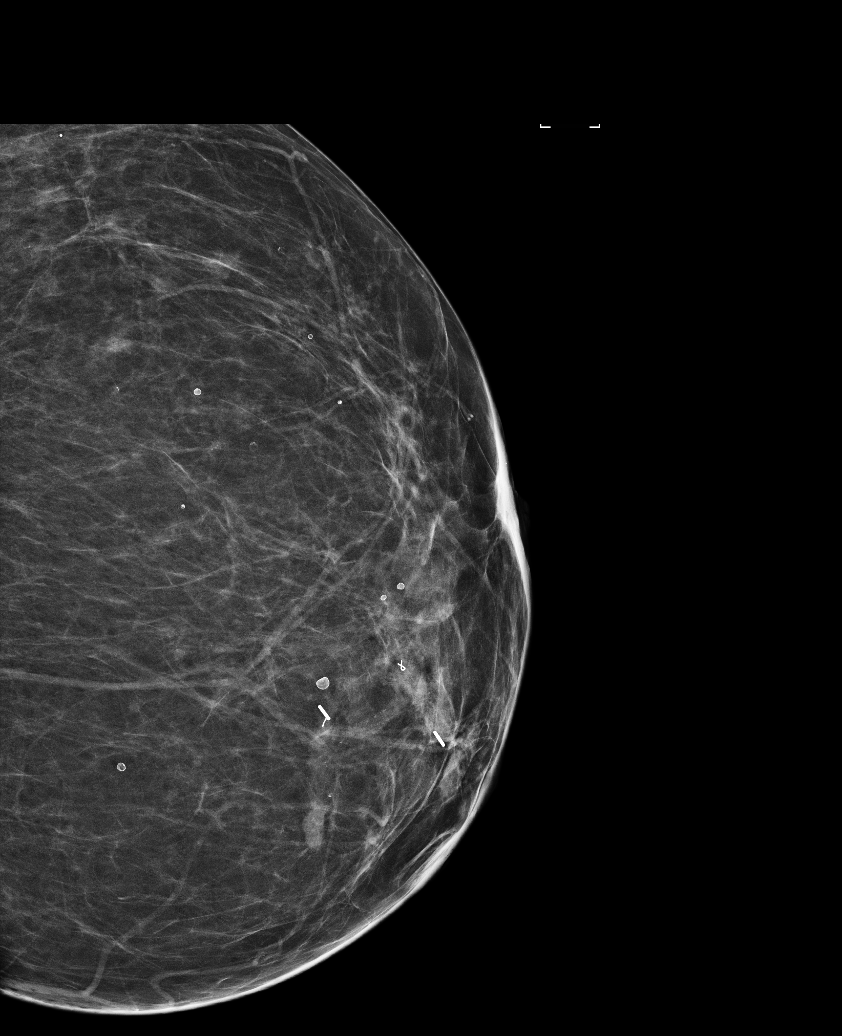

[L ML (3 of 3)]
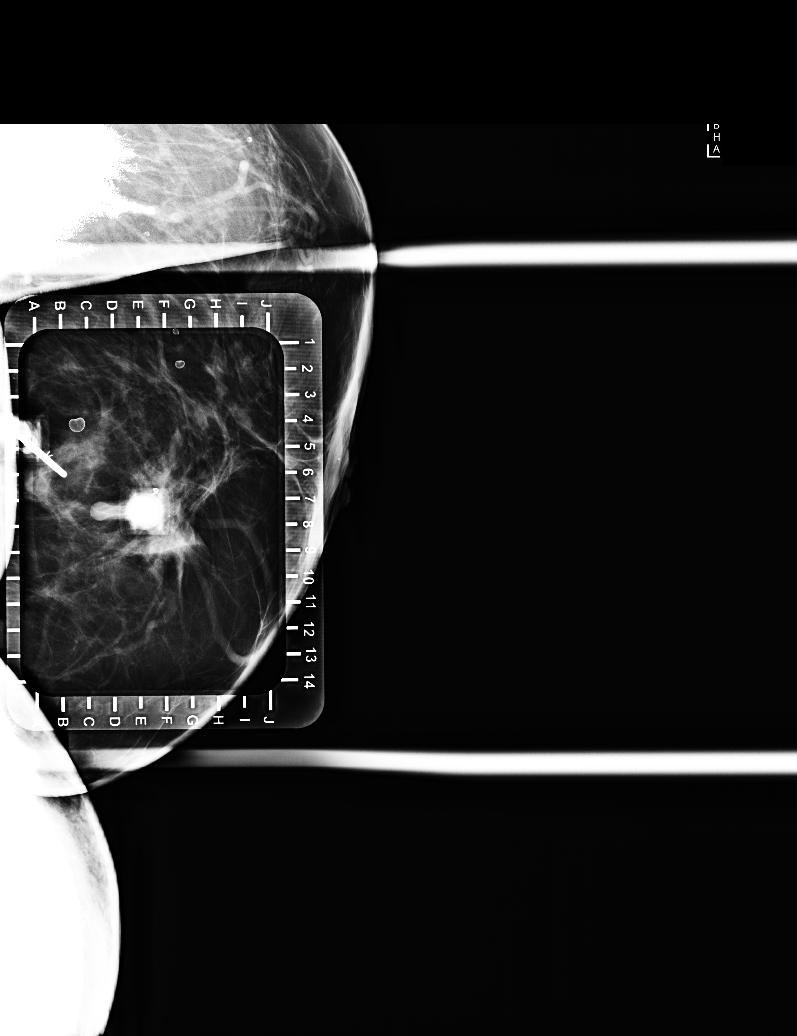

[6 of 6 positions shown; findings below may reference images not displayed]

FINDINGS: Patient presents for radioactive seed localization prior to
bracketed left lumpectomy. I met with the patient and we discussed
the procedure of seed localization including benefits and
alternatives. We discussed the high likelihood of successful
procedures. We discussed the risks of the procedures including
infection, bleeding, tissue injury and further surgery. We discussed
the low dose of radioactivity involved in the procedures. Informed,
written consent was given.

The usual time-out protocol was performed immediately prior to the
procedures.

SEED 1: MORE POSTERIORLY LOCATED X SHAPED BIOPSY MARKER CLIP IN THE
ANTERIOR LOWER INNER QUADRANT OF THE LEFT BREAST

Using mammographic guidance, sterile technique, 1% lidocaine and an
Q-21C radioactive seed, the X shaped biopsy marker clip in the more
posterior anterior lower inner quadrant of the left breast was
localized using a medial approach. The follow-up mammogram images
confirm the seed in the expected location and were marked for Dr.
Grettel.

Follow-up survey of the patient confirms presence of the radioactive
seed.

Order number of Q-21C seed:  [id number].

Total activity:  0.244 reference Date: 05/06/2021

SEED 2: 1.5 CM MEDIAL TO THE LATERALLY MIGRATED RIBBON SHAPED BIOPSY
MARKER CLIP AT THE MORE ANTERIOR SITE OF BIOPSY IN THE ANTERIOR
LOWER INNER QUADRANT OF THE LEFT BREAST

Using mammographic guidance, sterile technique, 1% lidocaine and an
Q-21C radioactive seed, the site of biopsy 1.5 cm medial to the
ribbon shaped biopsy marker clip in the more anterior portion of the
anterior lower inner quadrant of the left breast was localized using
a medial approach. The follow-up mammogram images confirm the seed
in the expected location and were marked for Dr. Grettel.

Follow-up survey of the patient confirms presence of the radioactive
seed.

Order number of Q-21C seed:  [id number].

Total activity:  0.244 mCi reference Date: 05/06/2021

The patient tolerated the procedure well and was released from the
[REDACTED]. She was given instructions regarding seed removal.
IMPRESSION: Radioactive seed localization left breast x 2. No apparent
complications.

## 2022-06-30 ENCOUNTER — Ambulatory Visit (HOSPITAL_COMMUNITY)
Admission: RE | Admit: 2022-06-30 | Discharge: 2022-06-30 | Disposition: A | Discharge status: Home / Self Care | Payer: BC Managed Care – PPO | Source: Ambulatory Visit | Attending: Surgery | Admitting: Surgery

## 2022-06-30 ENCOUNTER — Ambulatory Visit (INDEPENDENT_AMBULATORY_CARE_PROVIDER_SITE_OTHER): Payer: BC Managed Care – PPO | Admitting: Physician Assistant

## 2022-06-30 VITALS — BP 118/74 | HR 67 | Temp 98.0°F | Resp 20 | Ht 63.0 in | Wt 236.7 lb

## 2022-06-30 DIAGNOSIS — M7989 Other specified soft tissue disorders: Secondary | ICD-10-CM | POA: Diagnosis not present

## 2022-06-30 DIAGNOSIS — I8393 Asymptomatic varicose veins of bilateral lower extremities: Secondary | ICD-10-CM | POA: Diagnosis present

## 2022-06-30 DIAGNOSIS — I868 Varicose veins of other specified sites: Secondary | ICD-10-CM

## 2022-06-30 DIAGNOSIS — I83813 Varicose veins of bilateral lower extremities with pain: Secondary | ICD-10-CM | POA: Insufficient documentation

## 2022-06-30 NOTE — Progress Notes (Signed)
Requested by:  Trey Sailors, [place] [place],  [place] [id number]  Reason for consultation: bilateral leg swelling and pain    History of Present Illness   Destiny Little Destiny Little is a 51 y.o. (1971-08-29) female who presents for evaluation of bilateral lower leg swelling and pain.  The patient states that she has dealt with some lower leg swelling over the past couple years but it has gotten worse since she started her tamoxifen treatment for breast cancer.  The leg swelling makes her legs feel achy and tired at night.  She is not bothered by it during the day. She states she has also developed more spider veins on both legs since starting her medication.  The spider veins will itch and burn intermittently throughout the day. She states that they will get itchier and hurt more when she is sitting down.   She endorses occasional elevation of her legs in the evening, which helps a little. She denies any pain in her legs when she walks, rest pain, nonhealing wounds of the lower extremities.  She denies ever trying compression.  She denies having any previous vein procedures.  She denies a history of DVT or bleeding issues.  She denies a history of varicose veins.  Past Medical History:  Diagnosis Date   Anxiety    Breast cancer ([place])    History of kidney stones    Hypertension    Migraines    Personal history of radiation therapy 07/2021   Pre-diabetes     Past Surgical History:  Procedure Laterality Date   ABDOMINAL HYSTERECTOMY     BREAST LUMPECTOMY Left 05/2021   BREAST LUMPECTOMY WITH RADIOACTIVE SEED LOCALIZATION Left 06/12/2021   Procedure: LEFT BREAST LUMPECTOMY WITH RADIOACTIVE SEED LOCALIZATION X 2;  Surgeon: Stark Klein, MD;  Location: [facility];  Service: General;  Laterality: Left;  60 MINUTES ROOM 3   CESAREAN SECTION     x3   gastic bypass      Social History   Socioeconomic History   Marital status: Legally Separated    Spouse name: Not on  file   Number of children: 3   Years of education: Not on file   Highest education level: Not on file  Occupational History   Occupation: house keeping     Employer: [employer]  Tobacco Use   Smoking status: Never    Passive exposure: Never   Smokeless tobacco: Never  Vaping Use   Vaping Use: Never used  Substance and Sexual Activity   Alcohol use: Never   Drug use: Never   Sexual activity: Not on file  Other Topics Concern   Not on file  Social History Narrative   Not on file   Social Determinants of Health   Financial Resource Strain: Medium Risk (05/22/2021)   Overall Financial Resource Strain (CARDIA)    Difficulty of Paying Living Expenses: Somewhat hard  Food Insecurity: Food Insecurity Present (05/22/2021)   Hunger Vital Sign    Worried About Running Out of Food in the Last Year: Sometimes true    Ran Out of Food in the Last Year: Sometimes true  Transportation Needs: No Transportation Needs (05/22/2021)   PRAPARE - Hydrologist (Medical): No    Lack of Transportation (Non-Medical): No  Physical Activity: Not on file  Stress: Not on file  Social Connections: Not on file  Intimate Partner Violence: Not on file    Family  History  Problem Relation Age of Onset   Diabetes Father    Hypertension Father     Current Outpatient Medications  Medication Sig Dispense Refill   Cholecalciferol (VITAMIN D3) 1.25 MG (50000 UT) CAPS Take 1 capsule by mouth.     hyaluronate sodium (RADIAPLEXRX) GEL Apply 1 application topically 2 (two) times daily. 170 g 0   hydrOXYzine (VISTARIL) 25 MG capsule Take 25 mg by mouth daily as needed.     ibuprofen (ADVIL) 200 MG tablet Take 200 mg by mouth every 6 (six) hours as needed for moderate pain.     losartan-hydrochlorothiazide (HYZAAR) 50-12.5 MG tablet Take 1 tablet by mouth daily.     oxyCODONE (OXY IR/ROXICODONE) 5 MG immediate release tablet Take 1 tablet (5 mg total) by mouth every 6 (six) hours as  needed for severe pain. 10 tablet 0   potassium chloride (KLOR-CON) 10 MEQ tablet Take 10 mEq by mouth daily.     tamoxifen (NOLVADEX) 20 MG tablet Take 1 tablet (20 mg total) by mouth daily. 90 tablet 1   No current facility-administered medications for this visit.    No Known Allergies  REVIEW OF SYSTEMS (negative unless checked):   Cardiac:  '[]'$  Chest pain or chest pressure? '[]'$  Shortness of breath upon activity? '[]'$  Shortness of breath when lying flat? '[]'$  Irregular heart rhythm?  Vascular:  '[]'$  Pain in calf, thigh, or hip brought on by walking? '[]'$  Pain in feet at night that wakes you up from your sleep? '[]'$  Blood clot in your veins? '[x]'$  Leg swelling?  Pulmonary:  '[]'$  Oxygen at home? '[]'$  Productive cough? '[]'$  Wheezing?  Neurologic:  '[]'$  Sudden weakness in arms or legs? '[]'$  Sudden numbness in arms or legs? '[]'$  Sudden onset of difficult speaking or slurred speech? '[]'$  Temporary loss of vision in one eye? '[]'$  Problems with dizziness?  Gastrointestinal:  '[]'$  Blood in stool? '[]'$  Vomited blood?  Genitourinary:  '[]'$  Burning when urinating? '[]'$  Blood in urine?  Psychiatric:  '[]'$  Major depression  Hematologic:  '[]'$  Bleeding problems? '[]'$  Problems with blood clotting?  Dermatologic:  '[]'$  Rashes or ulcers?  Constitutional:  '[]'$  Fever or chills?  Ear/Nose/Throat:  '[]'$  Change in hearing? '[]'$  Nose bleeds? '[]'$  Sore throat?  Musculoskeletal:  '[]'$  Back pain? '[]'$  Joint pain? '[]'$  Muscle pain?   Physical Examination     Vitals:   06/30/22 1036  BP: 118/74  Pulse: 67  Resp: 20  Temp: 98 F (36.7 C)  TempSrc: Temporal  SpO2: 99%  Weight: 236 lb 11.2 oz (107.4 kg)  Height: '5\' 3"'$  (1.6 m)   Body mass index is 41.93 kg/m.  General:  WDWN in NAD; vital signs documented above Gait: Not observed HENT: WNL, normocephalic Pulmonary: normal non-labored breathing , without Rales, rhonchi,  wheezing Cardiac: regular HR, without murmurs, without carotid bruit Abdomen: soft, NT, no  masses Skin: without rashes Vascular Exam/Pulses: 2+ DP pulses bilaterally. Extremities: without varicose veins, with reticular veins, with edema, without stasis pigmentation, without lipodermatosclerosis, without ulcers Musculoskeletal: no muscle wasting or atrophy  Neurologic: A&O X 3;  No focal weakness or paresthesias are detected Psychiatric:  The pt has Normal affect.  Non-invasive Vascular Imaging   LLE Venous Insufficiency Duplex (06/30/2022):   +------------------+---------+------+-----------+------------+--------+  LEFT              Reflux NoRefluxReflux TimeDiameter cmsComments  Yes                                   +------------------+---------+------+-----------+------------+--------+  CFV                         yes   >1 second                       +------------------+---------+------+-----------+------------+--------+  FV mid            no                                              +------------------+---------+------+-----------+------------+--------+  Popliteal         no                                              +------------------+---------+------+-----------+------------+--------+  GSV at SFJ                  yes    >500 ms      0.66              +------------------+---------+------+-----------+------------+--------+  GSV prox thigh    no                            0.34              +------------------+---------+------+-----------+------------+--------+  GSV mid thigh     no                            0.28              +------------------+---------+------+-----------+------------+--------+  GSV dist thigh    no                            0.25              +------------------+---------+------+-----------+------------+--------+  GSV at knee       no                            0.19              +------------------+---------+------+-----------+------------+--------+   SSV Pop Fossa     no                            0.21              +------------------+---------+------+-----------+------------+--------+  anterior accessoryno                            0.31              +------------------+---------+------+-----------+------------+--------+    Medical Decision Making   [name] Destiny Little is a 51 y.o. female who presents with: bilateral lower extremity edema and spider veins   Based on the venous reflux study, the patient does not have reflux at multiple  areas of her great saphenous vein, and the vein is not large enough to be a candidate for laser ablation. The patient also has no DVT or superficial venous thrombosis seen on the duplex. The patient does have some mild lower extremity edema bilaterally, so I have counseled the patient to try leg elevation multiple times a day, increased in exercise, and use of mild compression stockings to reduce her symptoms. The patient was fitted for mild, knee high compression stockings bilaterally for daily use. She was also given an information sheet summing up the education for healthy veins. The patient does have a handful of reticular veins on both legs that itch and burn, and she would like them removed. I have set for her to be called by Izora Gala for discussion of sclerotherapy. She understands that this is a cosmetic procedure and she would have to pay out of pocket for the procedure. The patient can follow up as needed. She can return for repeat scanning if her symptoms worsen.   Addie Alonge Emmit Alexanders, PA-C [facility] Office: [phone]  06/30/2022, 2:56 PM  Clinic MD: Trula Slade

## 2022-07-30 ENCOUNTER — Telehealth: Payer: Self-pay | Admitting: Hematology

## 2022-07-30 NOTE — Telephone Encounter (Signed)
Left message with rescheduled upcoming appointment due to provider's PAL. 

## 2022-08-08 ENCOUNTER — Inpatient Hospital Stay: Payer: BC Managed Care – PPO

## 2022-08-08 ENCOUNTER — Inpatient Hospital Stay: Payer: BC Managed Care – PPO | Admitting: Hematology

## 2022-08-19 ENCOUNTER — Encounter: Payer: Self-pay | Admitting: Hematology

## 2022-08-19 ENCOUNTER — Other Ambulatory Visit: Payer: Self-pay

## 2022-08-19 ENCOUNTER — Inpatient Hospital Stay: Payer: BC Managed Care – PPO | Attending: Hematology

## 2022-08-19 ENCOUNTER — Inpatient Hospital Stay (HOSPITAL_BASED_OUTPATIENT_CLINIC_OR_DEPARTMENT_OTHER): Payer: BC Managed Care – PPO | Admitting: Hematology

## 2022-08-19 VITALS — BP 112/73 | HR 59 | Temp 98.3°F | Resp 18 | Ht 63.0 in | Wt 240.7 lb

## 2022-08-19 DIAGNOSIS — E66811 Obesity, class 1: Secondary | ICD-10-CM

## 2022-08-19 DIAGNOSIS — D0512 Intraductal carcinoma in situ of left breast: Secondary | ICD-10-CM | POA: Insufficient documentation

## 2022-08-19 DIAGNOSIS — Z79810 Long term (current) use of selective estrogen receptor modulators (SERMs): Secondary | ICD-10-CM | POA: Insufficient documentation

## 2022-08-19 DIAGNOSIS — N951 Menopausal and female climacteric states: Secondary | ICD-10-CM | POA: Diagnosis not present

## 2022-08-19 DIAGNOSIS — E669 Obesity, unspecified: Secondary | ICD-10-CM

## 2022-08-19 DIAGNOSIS — Z170 Estrogen receptor positive status [ER+]: Secondary | ICD-10-CM | POA: Insufficient documentation

## 2022-08-19 LAB — CBC WITH DIFFERENTIAL (CANCER CENTER ONLY)
Abs Immature Granulocytes: 0.01 10*3/uL (ref 0.00–0.07)
Basophils Absolute: 0 10*3/uL (ref 0.0–0.1)
Basophils Relative: 0 %
Eosinophils Absolute: 0 10*3/uL (ref 0.0–0.5)
Eosinophils Relative: 1 %
HCT: 37.5 % (ref 36.0–46.0)
Hemoglobin: 12.8 g/dL (ref 12.0–15.0)
Immature Granulocytes: 0 %
Lymphocytes Relative: 46 %
Lymphs Abs: 2.5 10*3/uL (ref 0.7–4.0)
MCH: 31.5 pg (ref 26.0–34.0)
MCHC: 34.1 g/dL (ref 30.0–36.0)
MCV: 92.4 fL (ref 80.0–100.0)
Monocytes Absolute: 0.4 10*3/uL (ref 0.1–1.0)
Monocytes Relative: 7 %
Neutro Abs: 2.5 10*3/uL (ref 1.7–7.7)
Neutrophils Relative %: 46 %
Platelet Count: 359 10*3/uL (ref 150–400)
RBC: 4.06 MIL/uL (ref 3.87–5.11)
RDW: 12.4 % (ref 11.5–15.5)
WBC Count: 5.5 10*3/uL (ref 4.0–10.5)
nRBC: 0 % (ref 0.0–0.2)

## 2022-08-19 LAB — CMP (CANCER CENTER ONLY)
ALT: 11 U/L (ref 0–44)
AST: 16 U/L (ref 15–41)
Albumin: 3.9 g/dL (ref 3.5–5.0)
Alkaline Phosphatase: 51 U/L (ref 38–126)
Anion gap: 6 (ref 5–15)
BUN: 20 mg/dL (ref 6–20)
CO2: 28 mmol/L (ref 22–32)
Calcium: 9.4 mg/dL (ref 8.9–10.3)
Chloride: 105 mmol/L (ref 98–111)
Creatinine: 0.68 mg/dL (ref 0.44–1.00)
GFR, Estimated: >60 mL/min (ref >60)
Glucose, Bld: 102 mg/dL — ABNORMAL HIGH (ref 70–99)
Potassium: 4.2 mmol/L (ref 3.5–5.1)
Sodium: 139 mmol/L (ref 135–145)
Total Bilirubin: 0.3 mg/dL (ref 0.3–1.2)
Total Protein: 6.9 g/dL (ref 6.5–8.1)

## 2022-08-19 MED ORDER — TAMOXIFEN CITRATE 10 MG PO TABS: 10.0000 mg | ORAL_TABLET | Freq: Every day | ORAL | 3 refills | Status: DC

## 2022-08-19 NOTE — Progress Notes (Signed)
[facility]   Telephone:(336) [phone] [phone]) [phone]   Clinic Follow up Note   Patient Care Team: Trey Sailors, PA as PCP - General (Physician Assistant) Truitt Merle, MD as Consulting Physician (Hematology) Stark Klein, MD as Consulting Physician (General Surgery) Eppie Gibson, MD as Attending Physician (Radiation Oncology) Alla Feeling, NP as Nurse Practitioner (Nurse Practitioner) Harmon Pier, RN as Registered Nurse  Date of Service:  08/19/2022  CHIEF COMPLAINT: f/u of left breast DCIS  CURRENT THERAPY:  Tamoxifen, started 08/2021, currently 10 mg since 08/2022  ASSESSMENT & PLAN:  Destiny Little is a 51 y.o. post-partial hysterectomy female with   1. Left breast DCIS, grade III, ER weakly+/PR-  -seen on screening mammogram. S/p left lumpectomy on 06/12/21 with Dr. Barry Dienes. 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 Margins not involved. -She received adjuvant radiation under Dr. Isidore Moos from 07/18/21 - 08/14/21. -she started tamoxifen around late September/early October 2022. She is tolerating well overall with hot flashes. -most recent mammogram 04/16/22 was benign. -she is clinically doing well, though she notes bilateral shoulder pain from her job. Labs reviewed, no concern. Physical exam was unremarkable. There is no clinical concern for recurrence. -given her frequent hot flashes and shoulder pain, we discussed different options, such as lowering her tamoxifen dose or changing medicines. She is s/p partial hysterectomy and her ovaries are still in place. We will try low dose tamoxifen 51m daily, for a total of 3 years given her weak ER -we also discussed additional screening with breast MRI. She is interested if her insurance will cover it.  2. Hot flashes, Weight management -secondary to tamoxifen -we will try 10 mg tamoxifen to help with hot flashes. -she initially lost 25 lbs earlier this year but has since gained it back. She requests  referral to weight management, I will order.  3. Shoulder and Back pain -she reports bilateral shoulder pain, possibly from her physical job. We will try low dose tamoxifen to see if this helps. -she also reports back pain and is interested in breast reduction. I will refer her.   4. Genetics  -Given her age, she is eligible for genetic testing. -Testing performed 05/22/21. Results were negative.   5. Social and Financial Support -she reports she is single with three daughters. She lives with one of them and her granddaughter. -she works as a mCounsellorfor [employer]     PLAN:  -continue tamoxifen, move to lower dose -referrals to plastic surgery and weight management -screening breast MRI 10/2022 -labs and f/u in 6 months   No problem-specific Assessment & Plan notes found for this encounter.   SUMMARY OF ONCOLOGIC HISTORY: Oncology History Overview Note  Cancer Staging Ductal carcinoma in situ (DCIS) of left breast Staging form: Breast, AJCC 8th Edition - Clinical stage from 05/15/2021: Stage 0 (cTis (DCIS), cN0, cM0, G3, ER+, PR-, HER2: Not Assessed) - Signed by FTruitt Merle MD on 05/21/2021 Stage prefix: Initial diagnosis Histologic grading system: 3 grade system - Pathologic stage from 06/12/2021: Stage Unknown (pTis (DCIS), pNX, G3, ER+, PR-, HER2-) - Signed by FTruitt Merle MD on 08/16/2021 Stage prefix: Initial diagnosis Histologic grading system: 3 grade system Residual tumor (R): R0 - None    Ductal carcinoma in situ (DCIS) of left breast  05/07/2021 Mammogram   IMPRESSION: 1. There is a 5 cm group of suspicious calcifications in the lower-inner quadrant of the left breast.   2. There are 2 small  adjacent groups of calcifications in the upper-outer quadrant of the right breast measuring 0.6 and 0.5 cm respectively, which may represent fibrocystic changes.   05/15/2021 Initial Biopsy   Diagnosis 1. Breast, left, needle core biopsy, left breast LIQ anterior -  DUCTAL CARCINOMA IN SITU WITH CALCIFICATIONS AND NECROSIS - SEE COMMENT 2. Breast, left, needle core biopsy, left breast LIQ posterior - DUCTAL CARCINOMA IN SITU WITH CALCIFICATIONS AND NECROSIS - SEE COMMENT Microscopic Comment 1. and 2. Based on the biopsy, the ductal carcinoma in situ has a comedo pattern, high nuclear grade and measures 0.2 cm in greatest linear extent. Prognostic markers (ER/PR) are pending and will be reported in an addendum. Dr. Saralyn Pilar reviewed the case and agrees with the above diagnosis. These results were called to [facility] on May 16, 2021.   05/15/2021 Cancer Staging   Staging form: Breast, AJCC 8th Edition - Clinical stage from 05/15/2021: Stage 0 (cTis (DCIS), cN0, cM0, G3, ER+, PR-, HER2: Not Assessed) - Signed by Truitt Merle, MD on 05/21/2021 Stage prefix: Initial diagnosis Histologic grading system: 3 grade system   05/15/2021 Receptors her2   ADDITIONAL INFORMATION: 1. PROGNOSTIC INDICATORS Results: IMMUNOHISTOCHEMICAL AND MORPHOMETRIC ANALYSIS PERFORMED MANUALLY Estrogen Receptor: 30%, POSITIVE, WEAK STAINING INTENSITY Progesterone Receptor: 0%, NEGATIVE   05/16/2021 Initial Diagnosis   Ductal carcinoma in situ (DCIS) of left breast   05/20/2021 Imaging   MRI breast IMPRESSION: Non masslike enhancement in the anterior aspect of the left breast consistent with post biopsy change/hematoma. No additional abnormal enhancement in the left breast is identified. No abnormal enhancement in the right breast.   05/23/2021 Pathology Results   Diagnosis 1. Breast, right, needle core biopsy, upper outer posterior (x clip) - COLUMNAR CELL AND FIBROCYSTIC CHANGES WITH APOCRINE METAPLASIA AND CALCIFICATIONS - NO MALIGNANCY IDENTIFIED 2. Breast, right, needle core biopsy, upper outer anterior (ribbon clip) - COLUMNAR CELL AND FIBROCYSTIC CHANGES WITH APOCRINE METAPLASIA AND CALCIFICATIONS - PSEUDOANGIOMATOUS STROMAL HYPERPLASIA - NO MALIGNANCY  IDENTIFIED   05/27/2021 Genetic Testing   Negative hereditary cancer genetic testing: no pathogenic variants detected in [facility] BRCAPlus Panel or CancerNext-Expanded +RNAinsight Panel.  The report dates are May 27, 2021 and June 02, 2021, respectively.   The BRCAplus panel offered by [facility] and includes sequencing and deletion/duplication analysis for the following 8 genes: ATM, BRCA1, BRCA2, CDH1, CHEK2, PALB2, PTEN, and TP53.  The CancerNext-Expanded gene panel offered by [facility] and includes sequencing, rearrangement, and RNA analysis for the following 77 genes: AIP, ALK, APC, ATM, AXIN2, BAP1, BARD1, BLM, BMPR1A, BRCA1, BRCA2, BRIP1, CDC73, CDH1, CDK4, CDKN1B, CDKN2A, CHEK2, CTNNA1, DICER1, FANCC, FH, FLCN, GALNT12, KIF1B, LZTR1, MAX, MEN1, MET, MLH1, MSH2, MSH3, MSH6, MUTYH, NBN, NF1, NF2, NTHL1, PALB2, PHOX2B, PMS2, POT1, PRKAR1A, PTCH1, PTEN, RAD51C, RAD51D, RB1, RECQL, RET, SDHA, SDHAF2, SDHB, SDHC, SDHD, SMAD4, SMARCA4, SMARCB1, SMARCE1, STK11, SUFU, TMEM127, TP53, TSC1, TSC2, VHL and XRCC2 (sequencing and deletion/duplication); EGFR, EGLN1, HOXB13, KIT, MITF, PDGFRA, POLD1, and POLE (sequencing only); EPCAM and GREM1 (deletion/duplication only).    06/12/2021 Pathology Results   FINAL MICROSCOPIC DIAGNOSIS:   A. BREAST, LEFT, LUMPECTOMY:  - Ductal carcinoma in situ, 4.8 cm.  - Margins not involved.  - DCIS focally 0.1 cm from posterior margin and 0.5 cm from medial margin.  - Biopsy site and biopsy clip.  - See oncology table.    06/12/2021 Cancer Staging   Staging form: Breast, AJCC 8th Edition - Pathologic stage from 06/12/2021: Stage Unknown (pTis (DCIS), pNX, G3, ER+, PR-,  HER2-) - Signed by Truitt Merle, MD on 08/16/2021 Stage prefix: Initial diagnosis Histologic grading system: 3 grade system Residual tumor (R): R0 - None   06/12/2021 Pathology Results   FINAL MICROSCOPIC DIAGNOSIS:   A. BREAST, LEFT, LUMPECTOMY:  - Ductal carcinoma in situ, 4.8 cm.  - Margins not  involved.  - DCIS focally 0.1 cm from posterior margin and 0.5 cm from medial margin.  - Biopsy site and biopsy clip.    06/12/2021 Surgery   Left lumpectomy   07/18/2021 - 08/14/2021 Radiation Therapy   Left breast radiation, under Dr. Isidore Moos   08/16/2021 - 08/16/2026 Anti-estrogen oral therapy   Tamoxifen to be taken daily for 5 years   11/18/2021 Survivorship   SCP delivered by Cira Rue, NP      INTERVAL HISTORY:  Nolene Ebbs is here for a follow up of DCIS. She was last seen by me on 02/07/22. She presents to the clinic accompanied by her daughter, who acts as our Optometrist. She reports she is doing well overall. She reports hot flashes, which are not bothersome and do not wake her up. She also reports bilateral shoulder pain, possibly related to her job. She endorses taking ibuprofen for this.   All other systems were reviewed with the patient and are negative.  MEDICAL HISTORY:  Past Medical History:  Diagnosis Date   Anxiety    Breast cancer ([place])    History of kidney stones    Hypertension    Migraines    Personal history of radiation therapy 07/2021   Pre-diabetes     SURGICAL HISTORY: Past Surgical History:  Procedure Laterality Date   ABDOMINAL HYSTERECTOMY     BREAST LUMPECTOMY Left 05/2021   BREAST LUMPECTOMY WITH RADIOACTIVE SEED LOCALIZATION Left 06/12/2021   Procedure: LEFT BREAST LUMPECTOMY WITH RADIOACTIVE SEED LOCALIZATION X 2;  Surgeon: Stark Klein, MD;  Location: [facility];  Service: General;  Laterality: Left;  39 MINUTES ROOM 3   CESAREAN SECTION     x3   gastic bypass      I have reviewed the social history and family history with the patient and they are unchanged from previous note.  ALLERGIES:  has No Known Allergies.  MEDICATIONS:  Current Outpatient Medications  Medication Sig Dispense Refill   tamoxifen (NOLVADEX) 10 MG tablet Take 1 tablet (10 mg total) by mouth daily. 90 tablet 3   Cholecalciferol (VITAMIN D3) 1.25 MG  (50000 UT) CAPS Take 1 capsule by mouth.     hyaluronate sodium (RADIAPLEXRX) GEL Apply 1 application topically 2 (two) times daily. 170 g 0   hydrOXYzine (VISTARIL) 25 MG capsule Take 25 mg by mouth daily as needed.     ibuprofen (ADVIL) 200 MG tablet Take 200 mg by mouth every 6 (six) hours as needed for moderate pain.     losartan-hydrochlorothiazide (HYZAAR) 50-12.5 MG tablet Take 1 tablet by mouth daily.     potassium chloride (KLOR-CON) 10 MEQ tablet Take 10 mEq by mouth daily.     No current facility-administered medications for this visit.    PHYSICAL EXAMINATION: ECOG PERFORMANCE STATUS: 0 - Asymptomatic  Vitals:   08/19/22 0822  BP: 112/73  Pulse: (!) 59  Resp: 18  Temp: 98.3 F (36.8 C)  SpO2: 100%   Wt Readings from Last 3 Encounters:  08/19/22 240 lb 11.2 oz (109.2 kg)  06/30/22 236 lb 11.2 oz (107.4 kg)  02/07/22 227 lb 9 oz (103.2 kg)     GENERAL:alert,  no distress and comfortable SKIN: skin color, texture, turgor are normal, no rashes or significant lesions EYES: normal, Conjunctiva are pink and non-injected, sclera clear  NECK: supple, thyroid normal size, non-tender, without nodularity LYMPH:  no palpable lymphadenopathy in the cervical, axillary LUNGS: clear to auscultation and percussion with normal breathing effort HEART: regular rate & rhythm and no murmurs and no lower extremity edema ABDOMEN:abdomen soft, non-tender and normal bowel sounds Musculoskeletal:no cyanosis of digits and no clubbing  NEURO: alert & oriented x 3 with fluent speech, no focal motor/sensory deficits BREAST: No palpable mass, nodules or adenopathy bilaterally. Breast exam benign.   LABORATORY DATA:  I have reviewed the data as listed    Latest Ref Rng & Units 08/19/2022    7:50 AM 02/07/2022    2:47 PM 06/06/2021    1:55 PM  CBC  WBC 4.0 - 10.5 K/uL 5.5  7.2  8.0   Hemoglobin 12.0 - 15.0 g/dL 12.8  12.2  12.9   Hematocrit 36.0 - 46.0 % 37.5  36.6  40.5   Platelets 150 - 400  K/uL 359  379  466         Latest Ref Rng & Units 08/19/2022    7:50 AM 02/07/2022    2:47 PM 06/06/2021    1:55 PM  CMP  Glucose 70 - 99 mg/dL 102  104  107   BUN 6 - 20 mg/dL _0 Creatinine 0.44 - 1.00 mg/dL 0.68  0.70  0.71   Sodium 135 - 145 mmol/L 139  139  137   Potassium 3.5 - 5.1 mmol/L 4.2  3.7  4.0   Chloride 98 - 111 mmol/L 105  105  103   CO2 22 - 32 mmol/L _1 Calcium 8.9 - 10.3 mg/dL 9.4  9.0  9.5   Total Protein 6.5 - 8.1 g/dL 6.9  6.9  7.1   Total Bilirubin 0.3 - 1.2 mg/dL 0.3  0.3  0.7   Alkaline Phos 38 - 126 U/L 51  55  55   AST 15 - 41 U/L _2 ALT 0 - 44 U/L _3 RADIOGRAPHIC STUDIES: I have personally reviewed the radiological images as listed and agreed with the findings in the report. No results found.    Orders Placed This Encounter  Procedures   MR BREAST BILATERAL W WO CONTRAST INC CAD    Standing Status:   Future    Standing Expiration Date:   08/20/2023    Order Specific Question:   If indicated for the ordered procedure, I authorize the administration of contrast media per Radiology protocol    Answer:   Yes    Order Specific Question:   What is the patient's sedation requirement?    Answer:   No Sedation    Order Specific Question:   Does the patient have a pacemaker or implanted devices?    Answer:   No    Order Specific Question:   Radiology Contrast Protocol - do NOT remove file path    Answer:   [email or web address]    Order Specific Question:   Preferred imaging location?    Answer:   [facility] (table limit-550lbs)   Ambulatory referral to Plastic Surgery    Referral Priority:   Routine    Referral Type:   Surgical    Referral Reason:  Specialty Services Required    Requested Specialty:   Plastic Surgery    Number of Visits Requested:   1   Amb Ref to Medical Weight Management    Referral Priority:   Routine    Referral Type:   Consultation     Number of Visits Requested:   1   All questions were answered. The patient knows to call the clinic with any problems, questions or concerns. No barriers to learning was detected. The total time spent in the appointment was 30 minutes.     Truitt Merle, MD 08/19/2022   I, Wilburn Mylar, am acting as scribe for Truitt Merle, MD.   I have reviewed the above documentation for accuracy and completeness, and I agree with the above.

## 2022-09-04 ENCOUNTER — Other Ambulatory Visit: Payer: BC Managed Care – PPO

## 2022-10-13 ENCOUNTER — Ambulatory Visit (INDEPENDENT_AMBULATORY_CARE_PROVIDER_SITE_OTHER): Payer: BC Managed Care – PPO | Admitting: Internal Medicine

## 2022-10-13 ENCOUNTER — Encounter (INDEPENDENT_AMBULATORY_CARE_PROVIDER_SITE_OTHER): Payer: Self-pay | Admitting: Internal Medicine

## 2022-10-13 VITALS — BP 124/80 | HR 73 | Temp 98.5°F | Ht 62.0 in | Wt 241.6 lb

## 2022-10-13 DIAGNOSIS — R7303 Prediabetes: Secondary | ICD-10-CM

## 2022-10-13 DIAGNOSIS — Z0289 Encounter for other administrative examinations: Secondary | ICD-10-CM

## 2022-10-13 DIAGNOSIS — Z6841 Body Mass Index (BMI) 40.0 and over, adult: Secondary | ICD-10-CM

## 2022-10-13 DIAGNOSIS — I10 Essential (primary) hypertension: Secondary | ICD-10-CM

## 2022-10-13 NOTE — Progress Notes (Deleted)
Office: [phone]  /  Fax: [phone]   Initial Visit  Destiny Little was seen in clinic today to evaluate for obesity. She is interested in losing weight to improve overall health and reduce the risk of weight related complications. She presents today to review program treatment options, initial physical assessment, and evaluation.     She was referred by: Specialist oncologist  When asked what else they would like to accomplish? She states: Improve existing medical conditions, Improve quality of life, and Improve self-confidence  When asked how has your weight affected you? She states: Contributed to medical problems, Problems with eating patterns, and Problems with depression and or anxiety  Some associated conditions: Hypertension, OSA, Prediabetes, and GERD  Contributing factors: Family history, Nutritional, Medications, Stress, Eating patterns, Mental health problems, and Pregnancy  Weight promoting medications identified: Contraceptives or hormonal therapy  Current nutrition plan: None  Current level of physical activity: NEAT  Current or previous pharmacotherapy: Other: does not remember, [place] several drugs in [place] country  Response to medication: Lost weight initially but was unable to sustain weight loss   Past medical history includes:   Past Medical History:  Diagnosis Date   Anxiety    Breast cancer ([place])    History of kidney stones    Hypertension    Migraines    Personal history of radiation therapy 07/2021   Pre-diabetes      Objective:   BP 124/80   Pulse 73   Temp 98.5 F (36.9 C)   Ht '5\' 2"'$  (1.575 m)   Wt 241 lb 9.6 oz (109.6 kg)   BMI 44.19 kg/m  She was weighed on the bioimpedance scale: Body mass index is 44.19 kg/m.  Peak Weight:300 ,Visceral Fat Rating:16, Body Fat%:50, Weight trend over the last 12 months: Increasing  General:  Alert, oriented and cooperative. Patient is in no acute distress.  Respiratory: Normal  respiratory effort, no problems with respiration noted  Extremities: Normal range of motion.    Mental Status: Normal mood and affect. Normal behavior. Normal judgment and thought content.   Assessment and Plan:  1. Class 3 severe obesity without serious comorbidity with body mass index (BMI) of 40.0 to 44.9 in adult, unspecified obesity type (HCC) ***   We reviewed weight, biometrics, associated medical conditions and contributing factors with patient. She would benefit from weight loss therapy via a modified calorie, low-carb, high-protein nutritional plan tailored to their REE (resting energy expenditure) which will be determined by indirect calorimetry.  We will also assess for cardiometabolic risk and nutritional derangements via fasting serologies at her next appointment.      Obesity Treatment / Action Plan:  {[email or web address]:[id number]::"Patient will work on garnering support from family and friends to begin weight loss journey.","Will work on eliminating or reducing the presence of highly palatable, calorie dense foods in the home.","Will complete provided nutritional and psychosocial assessment questionnaire before the next appointment.","Will be scheduled for indirect calorimetry to determine resting energy expenditure in a fasting state.  This will allow [place] to create a reduced calorie, high-protein meal plan to promote loss of fat mass while preserving muscle mass."}  Obesity Education Performed Today:  She was weighed on the bioimpedance scale and results were discussed and documented in the synopsis.  We discussed obesity as a disease and the importance of a more detailed evaluation of all the factors contributing to the disease.  We discussed the importance of long term lifestyle changes which include nutrition, exercise and behavioral  modifications as well as the importance of customizing this to her specific health and social needs.  We discussed the benefits of reaching  a healthier weight to alleviate the symptoms of existing conditions and reduce the risks of the biomechanical, metabolic and psychological effects of obesity.  Destiny Little Destiny Little appears to be in the action stage of change and states they are ready to start intensive lifestyle modifications and behavioral modifications.  *** minutes was spent today on this visit including the above counseling, pre-visit chart review, and post-visit documentation.  Reviewed by clinician on day of visit: allergies, medications, problem list, medical history, surgical history, family history, social history, and previous encounter notes.   I, ***, am acting as transcriptionist for ***   I have reviewed the above documentation for accuracy and completeness, and I agree with the above. ***

## 2022-10-13 NOTE — Progress Notes (Signed)
Office: [phone]  /  Fax: [phone]   Initial Visit  Destiny Little was seen in clinic today to evaluate for obesity. She is interested in losing weight to improve overall health and reduce the risk of weight related complications. She presents today to review program treatment options, initial physical assessment, and evaluation.  She has a history of gastric sleeve approximately 6 years ago.  She reports significant weight gain following her diagnosis of breast cancer and started tamoxifen therapy.  She is also gaining weight with her pregnancies she was referred by her oncologist for pharmacotherapy.  She was referred by: Specialist  When asked what else they would like to accomplish? She states: Improve existing medical conditions, Improve quality of life, Improve appearance, and Improve self-confidence  When asked how has your weight affected you? 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  Current or previous pharmacotherapy: Other: Had tried some unknown medications in her country  Response to medication: Lost weight initially but was unable to sustain weight loss   Past medical history includes:   Past Medical History:  Diagnosis Date   Anxiety    Breast cancer ([place])    History of kidney stones    Hypertension    Migraines    Personal history of radiation therapy 07/2021   Pre-diabetes      Objective:   BP 124/80   Pulse 73    Temp 98.5 F (36.9 C)   Ht '5\' 2"'$  (1.575 m)   Wt 241 lb 9.6 oz (109.6 kg)   BMI 44.19 kg/m  She was weighed on the bioimpedance scale: Body mass index is 44.19 kg/m.  Peak Weight: 290,Visceral Fat Rating: 16, Body Fat%: 50.5, Weight trend over the last 12 months: Decreasing  General:  Alert, oriented and cooperative. Patient is in no acute distress.  Respiratory: Normal respiratory effort, no problems with respiration noted  Extremities: Normal range of motion.    Mental Status: Normal mood and affect. Normal behavior. Normal judgment and thought content.   Assessment and Plan:  1. Class 3 severe obesity without serious comorbidity with body mass index (BMI) of 40.0 to 44.9 in adult, unspecified obesity type ([place]) She is status post sleeve gastrectomy 6 years ago.  We reviewed weight, biometrics, associated medical conditions and contributing factors with patient. She would benefit from weight loss therapy via a modified calorie, low-carb, high-protein nutritional plan tailored to their REE (resting energy expenditure) which will be determined by indirect calorimetry.  We will also assess for cardiometabolic risk and nutritional derangements via fasting serologies at her next appointment.   I also reviewed the role of pharmacotherapy and I explained to her that this is adjunct to a weight loss plan incorporating nutritional, behavioral and physical activity.  2. Prediabetes Per patient.  I cannot find an A1c on file.  There are some random blood sugars ranging between 10 5-1 63 but unknown fasting state.  Patient counseled on condition and risk of progression.  She benefits from weight loss therapy.  She is status post gastric bypass.  3. Hypertension, unspecified type  Blood pressures well controlled.  She is on losartan hydrochlorothiazide without any signs or symptoms of orthostasis.  She will continue current medications.  She will continue to monitor her blood pressure.  We will check  renal parameters with intake labs.        Obesity Treatment / Action Plan:  Patient will work on garnering support from family and friends to begin weight loss journey. Will work on eliminating or reducing the presence of highly palatable, calorie dense foods in the home. Will complete provided nutritional and psychosocial assessment questionnaire before the next appointment. Will be scheduled for indirect calorimetry to determine resting energy expenditure in a fasting state.  This will allow [place] to create a reduced calorie, high-protein meal plan to promote loss of fat mass while preserving muscle mass. Will think about ideas on how to incorporate physical activity into their daily routine. Was counseled on nutritional approaches to weight loss and benefits of complex carbs and high quality protein as part of nutritional weight management. Was counseled on pharmacotherapy and role as an adjunct in weight management.   Obesity Education Performed Today:  She was weighed on the bioimpedance scale and results were discussed and documented in the synopsis.  We discussed obesity as a disease and the importance of a more detailed evaluation of all the factors contributing to the disease.  We discussed the importance of long term lifestyle changes which include nutrition, exercise and behavioral modifications as well as the importance of customizing this to her specific health and social needs.  We discussed the benefits of reaching a healthier weight to alleviate the symptoms of existing conditions and reduce the risks of the biomechanical, metabolic and psychological effects of obesity.  [name] [name] appears to be in the action stage of change and states they are ready to start intensive lifestyle modifications and behavioral modifications.  30 minutes was spent today on this visit including the above counseling, pre-visit chart review, and post-visit  documentation.  Reviewed by clinician on day of visit: allergies, medications, problem list, medical history, surgical history, family history, social history, and previous encounter notes.     I have reviewed the above documentation for accuracy and completeness, and I agree with the above.  Thomes Dinning, MD

## 2022-10-22 ENCOUNTER — Other Ambulatory Visit: Payer: Self-pay

## 2022-10-28 ENCOUNTER — Ambulatory Visit (INDEPENDENT_AMBULATORY_CARE_PROVIDER_SITE_OTHER): Payer: BC Managed Care – PPO | Admitting: Internal Medicine

## 2022-10-28 ENCOUNTER — Encounter (INDEPENDENT_AMBULATORY_CARE_PROVIDER_SITE_OTHER): Payer: Self-pay | Admitting: Internal Medicine

## 2022-10-28 VITALS — BP 128/79 | HR 76 | Temp 98.1°F | Ht 62.0 in | Wt 241.0 lb

## 2022-10-28 DIAGNOSIS — F3289 Other specified depressive episodes: Secondary | ICD-10-CM

## 2022-10-28 DIAGNOSIS — Z6841 Body Mass Index (BMI) 40.0 and over, adult: Secondary | ICD-10-CM

## 2022-10-28 DIAGNOSIS — R0602 Shortness of breath: Secondary | ICD-10-CM

## 2022-10-28 DIAGNOSIS — I10 Essential (primary) hypertension: Secondary | ICD-10-CM

## 2022-10-28 DIAGNOSIS — R7303 Prediabetes: Secondary | ICD-10-CM

## 2022-10-28 DIAGNOSIS — R5383 Other fatigue: Secondary | ICD-10-CM

## 2022-10-28 DIAGNOSIS — Z903 Acquired absence of stomach [part of]: Secondary | ICD-10-CM

## 2022-10-30 LAB — VITAMIN B12: Vitamin B-12: 328 pg/mL (ref 232–1245)

## 2022-10-30 LAB — COMPREHENSIVE METABOLIC PANEL
ALT: 15 [IU]/L (ref 0–32)
AST: 19 [IU]/L (ref 0–40)
Albumin/Globulin Ratio: 1.4 (ref 1.2–2.2)
Albumin: 3.9 g/dL (ref 3.9–4.9)
Alkaline Phosphatase: 59 [IU]/L (ref 44–121)
BUN/Creatinine Ratio: 20 (ref 9–23)
BUN: 14 mg/dL (ref 6–24)
Bilirubin Total: 0.4 mg/dL (ref 0.0–1.2)
CO2: 22 mmol/L (ref 20–29)
Calcium: 9.5 mg/dL (ref 8.7–10.2)
Chloride: 106 mmol/L (ref 96–106)
Creatinine, Ser: 0.69 mg/dL (ref 0.57–1.00)
Globulin, Total: 2.8 g/dL (ref 1.5–4.5)
Glucose: 94 mg/dL (ref 70–99)
Potassium: 4.7 mmol/L (ref 3.5–5.2)
Sodium: 141 mmol/L (ref 134–144)
Total Protein: 6.7 g/dL (ref 6.0–8.5)
eGFR: 106 mL/min/{1.73_m2} (ref >59)

## 2022-10-30 LAB — IRON,TIBC AND FERRITIN PANEL
Ferritin: 51 ng/mL (ref 15–150)
Iron Saturation: 42 % (ref 15–55)
Iron: 153 ug/dL (ref 27–159)
Total Iron Binding Capacity: 361 ug/dL (ref 250–450)
UIBC: 208 ug/dL (ref 131–425)

## 2022-10-30 LAB — LIPID PANEL WITH LDL/HDL RATIO
Cholesterol, Total: 129 mg/dL (ref 100–199)
HDL: 47 mg/dL (ref >39)
LDL Chol Calc (NIH): 69 mg/dL (ref 0–99)
LDL/HDL Ratio: 1.5 {ratio} (ref 0.0–3.2)
Triglycerides: 58 mg/dL (ref 0–149)
VLDL Cholesterol Cal: 13 mg/dL (ref 5–40)

## 2022-10-30 LAB — VITAMIN D 25 HYDROXY (VIT D DEFICIENCY, FRACTURES): Vit D, 25-Hydroxy: 36.5 ng/mL (ref 30.0–100.0)

## 2022-10-30 LAB — HEMOGLOBIN A1C
Est. average glucose Bld gHb Est-mCnc: 126 mg/dL
Hgb A1c MFr Bld: 6 % — ABNORMAL HIGH (ref 4.8–5.6)

## 2022-10-30 LAB — TSH: TSH: 2.57 u[IU]/mL (ref 0.450–4.500)

## 2022-10-30 LAB — INSULIN, RANDOM: INSULIN: 7.2 u[IU]/mL (ref 2.6–24.9)

## 2022-11-03 ENCOUNTER — Ambulatory Visit
Admission: RE | Admit: 2022-11-03 | Discharge: 2022-11-03 | Disposition: A | Discharge status: Home / Self Care | Payer: BC Managed Care – PPO | Source: Ambulatory Visit | Attending: Hematology | Admitting: Hematology

## 2022-11-03 DIAGNOSIS — D0512 Intraductal carcinoma in situ of left breast: Secondary | ICD-10-CM

## 2022-11-03 MED ORDER — GADOPICLENOL 0.5 MMOL/ML IV SOLN
7.0000 mL | Freq: Once | INTRAVENOUS | Status: AC | PRN
  Administered 2022-11-03: 7 mL via INTRAVENOUS

## 2022-11-07 ENCOUNTER — Telehealth: Payer: Self-pay | Admitting: *Deleted

## 2022-11-07 NOTE — Telephone Encounter (Signed)
-----   Message from Truitt Merle, MD sent at 11/07/2022  9:03 AM EST ----- Please let pt know her breast MRI results, no concerns. Please remind her mammogram in 6 months, thanks   Truitt Merle  11/07/2022

## 2022-11-07 NOTE — Telephone Encounter (Signed)
Per Dr.Feng, called and left message with pt and pt daughter of message below. Advised to call office for any other concerns.

## 2022-11-11 ENCOUNTER — Encounter (INDEPENDENT_AMBULATORY_CARE_PROVIDER_SITE_OTHER): Payer: Self-pay | Admitting: Internal Medicine

## 2022-11-11 ENCOUNTER — Ambulatory Visit (INDEPENDENT_AMBULATORY_CARE_PROVIDER_SITE_OTHER): Payer: BC Managed Care – PPO | Admitting: Internal Medicine

## 2022-11-11 VITALS — BP 116/77 | HR 74 | Temp 98.4°F | Ht 62.0 in | Wt 238.0 lb

## 2022-11-11 DIAGNOSIS — Z6841 Body Mass Index (BMI) 40.0 and over, adult: Secondary | ICD-10-CM

## 2022-11-11 DIAGNOSIS — R7303 Prediabetes: Secondary | ICD-10-CM | POA: Diagnosis not present

## 2022-11-11 DIAGNOSIS — E669 Obesity, unspecified: Secondary | ICD-10-CM | POA: Diagnosis not present

## 2022-11-11 DIAGNOSIS — F3289 Other specified depressive episodes: Secondary | ICD-10-CM | POA: Diagnosis not present

## 2022-11-11 DIAGNOSIS — F411 Generalized anxiety disorder: Secondary | ICD-10-CM

## 2022-11-11 DIAGNOSIS — Z903 Acquired absence of stomach [part of]: Secondary | ICD-10-CM

## 2022-11-11 MED ORDER — ESCITALOPRAM OXALATE 10 MG PO TABS: 10.0000 mg | ORAL_TABLET | Freq: Every day | ORAL | 0 refills | Status: DC

## 2022-11-11 NOTE — Progress Notes (Signed)
 Chief Complaint:   OBESITY Destiny Little (MR# [id number]) is a 51 y.o. female who presents for evaluation and treatment of obesity and related comorbidities. Current BMI is Body mass index is 44.08 kg/m. Destiny Little has been struggling with her weight for many years and has been unsuccessful in either losing weight, maintaining weight loss, or reaching her healthy weight goal.  Destiny Little is currently in the action stage of change and ready to dedicate time achieving and maintaining a healthier weight. Destiny Little is interested in becoming our patient and working on intensive lifestyle modifications including (but not limited to) diet and exercise for weight loss.  Destiny Little's habits were reviewed today and are as follows: Her family eats meals together, she thinks her family will eat healthier with her, her desired weight loss is 51 lbs, she has been heavy most of her life, she started gaining weight after her second pregnancy in 2000, her heaviest weight ever was 290 pounds, she has significant food cravings issues, she is frequently drinking liquids with calories, she frequently makes poor food choices, she has problems with excessive hunger, and she struggles with emotional eating.  Depression Screen Destiny Little's Food and Mood (modified PHQ-9) score was 9.  Subjective:   1. Other fatigue Destiny Little admits to daytime somnolence and denies waking up still tired. Patient has a history of symptoms of daytime fatigue and morning headache. Destiny Little generally gets 8 hours of sleep per night, and states that she has generally restful sleep. Snoring is present. Apneic episodes are not present. Epworth Sleepiness Score is 9.   2. SOB (shortness of breath) on exertion Destiny Little notes increasing shortness of breath with exercising and seems to be worsening over time with weight gain. She notes getting out of breath sooner with activity than she used to. This has not gotten worse recently.  Destiny Little denies shortness of breath at rest or orthopnea.  3. Hypertension, unspecified type Destiny Little's blood pressure is well controlled on losartan.   4. Prediabetes Destiny Little is asymptomatic. Destiny Little has a diagnosis of prediabetes based on her elevated HgA1c and was informed this puts her at greater risk of developing diabetes. She continues to work on diet and exercise to decrease her risk of diabetes. She denies nausea or hypoglycemia.  5. H/O gastric sleeve Destiny Little is status post gastric sleeve in 2019, and her nadir weight was 208 lbs with weight regain after 12-16 months.  6. Other depression, with emotional eating Destiny Little's diagnosis is associated with stress and anxiety.   Assessment/Plan:   1. Other fatigue Destiny Little does feel that her weight is causing her energy to be lower than it should be. Fatigue may be related to obesity, depression or many other causes. Labs will be ordered, and in the meanwhile, Destiny Little will focus on self care including making healthy food choices, increasing physical activity and focusing on stress reduction.  - EKG 12-Lead  2. SOB (shortness of breath) on exertion Destiny Little does feel that she gets out of breath more easily that she used to when she exercises. Destiny Little's shortness of breath appears to be obesity related and exercise induced. She has agreed to work on weight loss and gradually increase exercise to treat her exercise induced shortness of breath. Will continue to monitor closely.  3. Hypertension, unspecified type We will check labs today. Destiny Little will continue losartan, and she will work on her weight loss therapy.   - Lipid Panel With LDL/HDL Ratio - TSH  4. Prediabetes We will check  labs today. Destiny Little will continue losartan, and she will work on her weight loss therapy.   - Comprehensive metabolic panel - Hemoglobin A1c - Insulin, random  5. H/O gastric sleeve We will check labs today, and we will follow-up at [facility]  next office visit.   - VITAMIN D 25 Hydroxy (Vit-D Deficiency, Fractures) - Vitamin B12 - Iron, TIBC and Ferritin Panel  6. Other depression, with emotional eating We will discuss generalized anxiety disorder at her next office visit. She was counseled on behavioral techniques.   7. Class 3 severe obesity without serious comorbidity with body mass index (BMI) of 40.0 to 44.9 in adult, unspecified obesity type (HCC) Destiny Little is currently in the action stage of change and her goal is to continue with weight loss efforts. I recommend Destiny Little begin the structured treatment plan as follows:  She has agreed to the Category 2 Plan.  Exercise goals: All adults should avoid inactivity. Some physical activity is better than none, and adults who participate in any amount of physical activity gain some health benefits.   Behavioral modification strategies: increasing lean protein intake, decreasing simple carbohydrates, increasing vegetables, increasing water intake, decreasing liquid calories, increasing high fiber foods, no skipping meals, better snacking choices, emotional eating strategies, avoiding temptations, and planning for success.  She was informed of the importance of frequent follow-up visits to maximize her success with intensive lifestyle modifications for her multiple health conditions. She was informed we would discuss her lab results at her next visit unless there is a critical issue that needs to be addressed sooner. Destiny Little agreed to keep her next visit at the agreed upon time to discuss these results.  Objective:   Blood pressure 128/79, pulse 76, temperature 98.1 F (36.7 C), height 5\' 2"  (1.575 m), weight 241 lb (109.3 kg), SpO2 99 %. Body mass index is 44.08 kg/m.  EKG: Normal sinus rhythm, rate 69 BPM.  Indirect Calorimeter completed today shows a VO2 of 235 and a REE of 1627.  Her calculated basal metabolic rate is 1610 thus her basal metabolic rate is worse than  expected.  General: Cooperative, alert, well developed, in no acute distress. HEENT: Conjunctivae and lids unremarkable. Cardiovascular: Regular rhythm.  Lungs: Normal work of breathing. Neurologic: No focal deficits.   Lab Results  Component Value Date   CREATININE 0.69 10/28/2022   BUN 14 10/28/2022   NA 141 10/28/2022   K 4.7 10/28/2022   CL 106 10/28/2022   CO2 22 10/28/2022   Lab Results  Component Value Date   ALT 15 10/28/2022   AST 19 10/28/2022   ALKPHOS 59 10/28/2022   BILITOT 0.4 10/28/2022   Lab Results  Component Value Date   HGBA1C 6.0 (H) 10/28/2022   Lab Results  Component Value Date   INSULIN 7.2 10/28/2022   Lab Results  Component Value Date   TSH 2.570 10/28/2022   Lab Results  Component Value Date   CHOL 129 10/28/2022   HDL 47 10/28/2022   LDLCALC 69 10/28/2022   TRIG 58 10/28/2022   Lab Results  Component Value Date   WBC 5.5 08/19/2022   HGB 12.8 08/19/2022   HCT 37.5 08/19/2022   MCV 92.4 08/19/2022   PLT 359 08/19/2022   Lab Results  Component Value Date   IRON 153 10/28/2022   TIBC 361 10/28/2022   FERRITIN 51 10/28/2022   Attestation Statements:   Reviewed by clinician on day of visit: allergies, medications, problem list, medical history, surgical history, family  history, social history, and previous encounter notes.  Time spent on visit including pre-visit chart review and post-visit charting and care was 40 minutes.   Trude Mcburney, am acting as transcriptionist for Worthy Rancher, MD.  I have reviewed the above documentation for accuracy and completeness, and I agree with the above. -Worthy Rancher, MD

## 2022-11-24 NOTE — Progress Notes (Signed)
 Chief Complaint:   OBESITY Destiny Little is here to discuss her progress with her obesity treatment plan along with follow-up of her obesity related diagnoses. [name] is on the Category 2 Plan and states she is following her eating plan approximately 35% of the time. [name] states she is not currently exercising.  Today's visit was #: 2 Starting weight: 241 lbs Starting date: 10/28/2022 Today's weight: 239 lbs Today's date: 11/11/2022 Total lbs lost to date: 2 Total lbs lost since last in-office visit: 2  Interim History: Pt reports high levels of stress due to family problems. She is also having trouble getting breakfast in the morning, as it affects her appetite at lunch, and she has an eating schedule she is following. Pt has problem wit excessive hunger and cravings for carbs at night related to stress. Pt denies feeling depressed.  Subjective:   1. Prediabetes Discussed labs with patient today. Destiny Little has a diagnosis of prediabetes based on her elevated HgA1c and was informed this puts her at greater risk of developing diabetes. She continues to work on diet and exercise to decrease her risk of diabetes. She denies nausea or hypoglycemia. A1c 6.0 with normal lipids and TSH.  2. S/P gastric sleeve procedure Normal B12 and low normal Vitamin D levels.  3. Other depression, with emotional eating Patient has psychological drivers contributing to cravings for carbs at night.  Mostly around anxiety.  4. Anxiety state New. Triggering emotional eating. Pt declined referral to counseling due to travel plans.  We will administer GAD-7 at the next office visit.  Assessment/Plan:   1. Prediabetes Adine will continue to work on weight loss, exercise, and decreasing simple carbohydrates to help decrease the risk of diabetes.  Consider Metformin for type 2 diabetes prevention and weight management.  2. S/P gastric sleeve procedure Start OTC Vitamin D 2,000 IU daily.  3.  Other depression, with emotional eating After  discussion of benefits and side effects, we will start escitalopram 10 mg, 1/2 tablet daily.  Start- escitalopram (LEXAPRO) 10 MG tablet; Take 1 tablet (10 mg total) by mouth daily.  Dispense: 30 tablet; Refill: 0  4. Anxiety state Counseling on selection techniques. Start escitalopram 10 mg, 1/2 tab daily in the evenings.  5. Obesity, current BMI 43.7 Destiny Little is currently in the action stage of change. As such, her goal is to continue with weight loss efforts. She has agreed to the Category 2 Plan.   Pt will eat breakfast later in the morning. She will add a snack or protein shake late afternoon to reduce overeating in the evenings.  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  Destiny Little has agreed to follow-up with our clinic in 4 weeks. She was informed of the importance of frequent follow-up visits to maximize her success with intensive lifestyle modifications for her multiple health conditions.   Objective:   Blood pressure 116/77, pulse 74, temperature 98.4 F (36.9 C), height 5\' 2"  (1.575 m), weight 238 lb (108 kg), SpO2 100 %. Body mass index is 43.53 kg/m.  General: Cooperative, alert, well developed, in no acute distress. HEENT: Conjunctivae and lids unremarkable. Cardiovascular: Regular rhythm.  Lungs: Normal work of breathing. Neurologic: No focal deficits.   Lab Results  Component Value Date   CREATININE 0.69 10/28/2022   BUN 14 10/28/2022  NA 141 10/28/2022   K 4.7 10/28/2022   CL 106 10/28/2022   CO2 22 10/28/2022   Lab Results  Component Value Date   ALT 15 10/28/2022   AST 19 10/28/2022   ALKPHOS 59 10/28/2022   BILITOT 0.4 10/28/2022   Lab Results   Component Value Date   HGBA1C 6.0 (H) 10/28/2022   Lab Results  Component Value Date   INSULIN 7.2 10/28/2022   Lab Results  Component Value Date   TSH 2.570 10/28/2022   Lab Results  Component Value Date   CHOL 129 10/28/2022   HDL 47 10/28/2022   LDLCALC 69 10/28/2022   TRIG 58 10/28/2022   Lab Results  Component Value Date   VD25OH 36.5 10/28/2022   Lab Results  Component Value Date   WBC 5.5 08/19/2022   HGB 12.8 08/19/2022   HCT 37.5 08/19/2022   MCV 92.4 08/19/2022   PLT 359 08/19/2022   Lab Results  Component Value Date   IRON 153 10/28/2022   TIBC 361 10/28/2022   FERRITIN 51 10/28/2022    Attestation Statements:   Reviewed by clinician on day of visit: allergies, medications, problem list, medical history, surgical history, family history, social history, and previous encounter notes.  Time spent on visit including pre-visit chart review and post-visit care and charting was 40 minutes.   I, Kyung Rudd, BS, CMA, am acting as transcriptionist for Worthy Rancher, MD.  I have reviewed the above documentation for accuracy and completeness, and I agree with the above. -Worthy Rancher, MD

## 2022-12-07 ENCOUNTER — Other Ambulatory Visit (INDEPENDENT_AMBULATORY_CARE_PROVIDER_SITE_OTHER): Payer: Self-pay | Admitting: Internal Medicine

## 2022-12-07 DIAGNOSIS — F3289 Other specified depressive episodes: Secondary | ICD-10-CM

## 2022-12-30 ENCOUNTER — Ambulatory Visit (INDEPENDENT_AMBULATORY_CARE_PROVIDER_SITE_OTHER): Payer: BC Managed Care – PPO | Admitting: Internal Medicine

## 2023-02-17 ENCOUNTER — Other Ambulatory Visit: Payer: Self-pay

## 2023-02-17 DIAGNOSIS — D0512 Intraductal carcinoma in situ of left breast: Secondary | ICD-10-CM

## 2023-02-17 NOTE — Assessment & Plan Note (Deleted)
grade III, ER weakly+/PR-  -seen on screening mammogram. S/p left lumpectomy on 06/12/21 with Dr. Barry Dienes. 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 Margins not involved. -She received adjuvant radiation under Dr. Isidore Moos from 07/18/21 - 08/14/21. -she started tamoxifen around late September/early October 2022. She is tolerating well overall with hot flashes. -most recent mammogram 04/16/22 was benign. -she is clinically doing well, though she notes bilateral shoulder pain from her job. Labs reviewed, no concern. Physical exam was unremarkable. There is no clinical concern for recurrence. -given her frequent hot flashes, we change her to low dose tamoxifen '10mg'$  daily, for a total of 3 years given her weak ER -we also discussed additional screening with breast MRI. She is interested if her insurance will cover it.

## 2023-02-18 ENCOUNTER — Inpatient Hospital Stay: Payer: BC Managed Care – PPO | Admitting: Hematology

## 2023-02-18 ENCOUNTER — Inpatient Hospital Stay: Payer: BC Managed Care – PPO | Attending: Hematology

## 2023-02-18 DIAGNOSIS — Z79810 Long term (current) use of selective estrogen receptor modulators (SERMs): Secondary | ICD-10-CM | POA: Insufficient documentation

## 2023-02-18 DIAGNOSIS — Z170 Estrogen receptor positive status [ER+]: Secondary | ICD-10-CM | POA: Insufficient documentation

## 2023-02-18 DIAGNOSIS — D0512 Intraductal carcinoma in situ of left breast: Secondary | ICD-10-CM | POA: Insufficient documentation

## 2023-02-19 ENCOUNTER — Telehealth: Payer: Self-pay | Admitting: Hematology

## 2023-02-19 NOTE — Telephone Encounter (Signed)
Patient called to reschedule missed 3/6 appointments. Called patient back with interpreter line to reschedule appointment. Patient notified.

## 2023-03-05 NOTE — Assessment & Plan Note (Addendum)
grade III, ER weakly+/PR-  -seen on screening mammogram. S/p left lumpectomy on 06/12/21 with Dr. Barry Dienes. 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 Margins not involved. -She received adjuvant radiation under Dr. Isidore Moos from 07/18/21 - 08/14/21. -she started tamoxifen around late September/early October 2022. Due to hot flushes, I reduced dose to 10mg  daily, plan for a total of 3 years -last mammogram 04/16/22 was benign, screening breast MRI in November 2023 was also benign. -she will continue annual mammogram and screening breast MRI, 6 months apart.

## 2023-03-06 ENCOUNTER — Inpatient Hospital Stay: Payer: BC Managed Care – PPO

## 2023-03-06 ENCOUNTER — Other Ambulatory Visit: Payer: Self-pay

## 2023-03-06 ENCOUNTER — Encounter: Payer: Self-pay | Admitting: Hematology

## 2023-03-06 ENCOUNTER — Inpatient Hospital Stay (HOSPITAL_BASED_OUTPATIENT_CLINIC_OR_DEPARTMENT_OTHER): Payer: BC Managed Care – PPO | Admitting: Hematology

## 2023-03-06 VITALS — BP 125/77 | HR 72 | Temp 97.9°F | Resp 17 | Ht 62.0 in | Wt 253.7 lb

## 2023-03-06 DIAGNOSIS — D0512 Intraductal carcinoma in situ of left breast: Secondary | ICD-10-CM | POA: Diagnosis present

## 2023-03-06 DIAGNOSIS — Z79810 Long term (current) use of selective estrogen receptor modulators (SERMs): Secondary | ICD-10-CM | POA: Diagnosis not present

## 2023-03-06 DIAGNOSIS — Z170 Estrogen receptor positive status [ER+]: Secondary | ICD-10-CM | POA: Diagnosis not present

## 2023-03-06 LAB — CMP (CANCER CENTER ONLY)
ALT: 12 U/L (ref 0–44)
AST: 16 U/L (ref 15–41)
Albumin: 4 g/dL (ref 3.5–5.0)
Alkaline Phosphatase: 57 U/L (ref 38–126)
Anion gap: 3 — ABNORMAL LOW (ref 5–15)
BUN: 24 mg/dL — ABNORMAL HIGH (ref 6–20)
CO2: 31 mmol/L (ref 22–32)
Calcium: 9.3 mg/dL (ref 8.9–10.3)
Chloride: 106 mmol/L (ref 98–111)
Creatinine: 0.63 mg/dL (ref 0.44–1.00)
GFR, Estimated: >60 mL/min (ref >60)
Glucose, Bld: 97 mg/dL (ref 70–99)
Potassium: 4.1 mmol/L (ref 3.5–5.1)
Sodium: 140 mmol/L (ref 135–145)
Total Bilirubin: 0.4 mg/dL (ref 0.3–1.2)
Total Protein: 7.1 g/dL (ref 6.5–8.1)

## 2023-03-06 LAB — CBC WITH DIFFERENTIAL (CANCER CENTER ONLY)
Abs Immature Granulocytes: 0.01 10*3/uL (ref 0.00–0.07)
Basophils Absolute: 0 10*3/uL (ref 0.0–0.1)
Basophils Relative: 0 %
Eosinophils Absolute: 0.1 10*3/uL (ref 0.0–0.5)
Eosinophils Relative: 1 %
HCT: 38.4 % (ref 36.0–46.0)
Hemoglobin: 12.9 g/dL (ref 12.0–15.0)
Immature Granulocytes: 0 %
Lymphocytes Relative: 53 %
Lymphs Abs: 3.3 10*3/uL (ref 0.7–4.0)
MCH: 31.2 pg (ref 26.0–34.0)
MCHC: 33.6 g/dL (ref 30.0–36.0)
MCV: 93 fL (ref 80.0–100.0)
Monocytes Absolute: 0.5 10*3/uL (ref 0.1–1.0)
Monocytes Relative: 8 %
Neutro Abs: 2.4 10*3/uL (ref 1.7–7.7)
Neutrophils Relative %: 38 %
Platelet Count: 354 10*3/uL (ref 150–400)
RBC: 4.13 MIL/uL (ref 3.87–5.11)
RDW: 12.7 % (ref 11.5–15.5)
WBC Count: 6.3 10*3/uL (ref 4.0–10.5)
nRBC: 0 % (ref 0.0–0.2)

## 2023-03-06 NOTE — Progress Notes (Signed)
[place]   [phone] [phone]   Clinic Follow up Note   Patient Care Team: Trey Sailors, PA as PCP - General (Physician Assistant) Truitt Merle, MD as Consulting Physician (Hematology) Stark Klein, MD as Consulting Physician (General Surgery) Eppie Gibson, MD as Attending Physician (Radiation Oncology) Alla Feeling, NP as Nurse Practitioner (Nurse Practitioner) Harmon Pier, RN as Registered Nurse  Date of Service:  03/06/2023  CHIEF COMPLAINT: f/u of left breast DCIS   CURRENT THERAPY:   Tamoxifen, started 08/2021, currently 10 mg since 08/2022   ASSESSMENT:  Destiny Little is a 52 y.o. 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 S/p left lumpectomy on 06/12/21 with Dr. Barry Dienes. 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 Margins not involved. -She received adjuvant radiation under Dr. Isidore Moos from 07/18/21 - 08/14/21. -she started tamoxifen around late September/early October 2022. Due to hot flushes, I reduced dose to 10mg  daily, plan for a total of 3 years -Due to worsening depression, and small benefit of tamoxifen, I recommend her to stop tamoxifen.  She agrees. -last mammogram 04/16/22 was benign, screening breast MRI in November 2023 was also benign. -she will continue annual mammogram and screening breast MRI, 6 months apart.    PLAN: - Discontinue Tamoxifen due to hot flashes and mood swings -Order Mammogram -04/2023 -f/u in 6 months, then yearly.  SUMMARY OF ONCOLOGIC HISTORY: Oncology History Overview Note  Cancer Staging Ductal carcinoma in situ (DCIS) of left breast Staging form: Breast, AJCC 8th Edition - Clinical stage from 05/15/2021: Stage 0 (cTis (DCIS), cN0, cM0, G3, ER+, PR-, HER2: Not Assessed) - Signed by Truitt Merle, MD on 05/21/2021 Stage prefix: Initial diagnosis Histologic grading system: 3 grade system - Pathologic stage  from 06/12/2021: Stage Unknown (pTis (DCIS), pNX, G3, ER+, PR-, HER2-) - Signed by Truitt Merle, MD on 08/16/2021 Stage prefix: Initial diagnosis Histologic grading system: 3 grade system Residual tumor (R): R0 - None    Ductal carcinoma in situ (DCIS) of left breast  05/07/2021 Mammogram   IMPRESSION: 1. There is a 5 cm group of suspicious calcifications in the lower-inner quadrant of the left breast.   2. There are 2 small adjacent groups of calcifications in the upper-outer quadrant of the right breast measuring 0.6 and 0.5 cm respectively, which may represent fibrocystic changes.   05/15/2021 Initial Biopsy   Diagnosis 1. Breast, left, needle core biopsy, left breast LIQ anterior - DUCTAL CARCINOMA IN SITU WITH CALCIFICATIONS AND NECROSIS - SEE COMMENT 2. Breast, left, needle core biopsy, left breast LIQ posterior - DUCTAL CARCINOMA IN SITU WITH CALCIFICATIONS AND NECROSIS - SEE COMMENT Microscopic Comment 1. and 2. Based on the biopsy, the ductal carcinoma in situ has a comedo pattern, high nuclear grade and measures 0.2 cm in greatest linear extent. Prognostic markers (ER/PR) are pending and will be reported in an addendum. Dr. Saralyn Pilar reviewed the case and agrees with the above diagnosis. These results were called to [facility] on May 16, 2021.   05/15/2021 Cancer Staging   Staging form: Breast, AJCC 8th Edition - Clinical stage from 05/15/2021: Stage 0 (cTis (DCIS), cN0, cM0, G3, ER+, PR-, HER2: Not Assessed) - Signed by Truitt Merle, MD on 05/21/2021 Stage prefix: Initial diagnosis Histologic grading system: 3 grade system   05/15/2021 Receptors her2   ADDITIONAL INFORMATION: 1. PROGNOSTIC INDICATORS Results: IMMUNOHISTOCHEMICAL AND  MORPHOMETRIC ANALYSIS PERFORMED MANUALLY Estrogen Receptor: 30%, POSITIVE, WEAK STAINING INTENSITY Progesterone Receptor: 0%, NEGATIVE   05/16/2021 Initial Diagnosis   Ductal carcinoma in situ (DCIS) of left breast   05/20/2021 Imaging    MRI breast IMPRESSION: Non masslike enhancement in the anterior aspect of the left breast consistent with post biopsy change/hematoma. No additional abnormal enhancement in the left breast is identified. No abnormal enhancement in the right breast.   05/23/2021 Pathology Results   Diagnosis 1. Breast, right, needle core biopsy, upper outer posterior (x clip) - COLUMNAR CELL AND FIBROCYSTIC CHANGES WITH APOCRINE METAPLASIA AND CALCIFICATIONS - NO MALIGNANCY IDENTIFIED 2. Breast, right, needle core biopsy, upper outer anterior (ribbon clip) - COLUMNAR CELL AND FIBROCYSTIC CHANGES WITH APOCRINE METAPLASIA AND CALCIFICATIONS - PSEUDOANGIOMATOUS STROMAL HYPERPLASIA - NO MALIGNANCY IDENTIFIED   05/27/2021 Genetic Testing   Negative hereditary cancer genetic testing: no pathogenic variants detected in [facility] BRCAPlus Panel or CancerNext-Expanded +RNAinsight Panel.  The report dates are May 27, 2021 and June 02, 2021, respectively.   The BRCAplus panel offered by [employer] and includes sequencing and deletion/duplication analysis for the following 8 genes: ATM, BRCA1, BRCA2, CDH1, CHEK2, PALB2, PTEN, and TP53.  The CancerNext-Expanded gene panel offered by [facility] and includes sequencing, rearrangement, and RNA analysis for the following 77 genes: AIP, ALK, APC, ATM, AXIN2, BAP1, BARD1, BLM, BMPR1A, BRCA1, BRCA2, BRIP1, CDC73, CDH1, CDK4, CDKN1B, CDKN2A, CHEK2, CTNNA1, DICER1, FANCC, FH, FLCN, GALNT12, KIF1B, LZTR1, MAX, MEN1, MET, MLH1, MSH2, MSH3, MSH6, MUTYH, NBN, NF1, NF2, NTHL1, PALB2, PHOX2B, PMS2, POT1, PRKAR1A, PTCH1, PTEN, RAD51C, RAD51D, RB1, RECQL, RET, SDHA, SDHAF2, SDHB, SDHC, SDHD, SMAD4, SMARCA4, SMARCB1, SMARCE1, STK11, SUFU, TMEM127, TP53, TSC1, TSC2, VHL and XRCC2 (sequencing and deletion/duplication); EGFR, EGLN1, HOXB13, KIT, MITF, PDGFRA, POLD1, and POLE (sequencing only); EPCAM and GREM1 (deletion/duplication only).    06/12/2021 Pathology Results   FINAL MICROSCOPIC  DIAGNOSIS:   A. BREAST, LEFT, LUMPECTOMY:  - Ductal carcinoma in situ, 4.8 cm.  - Margins not involved.  - DCIS focally 0.1 cm from posterior margin and 0.5 cm from medial margin.  - Biopsy site and biopsy clip.  - See oncology table.    06/12/2021 Cancer Staging   Staging form: Breast, AJCC 8th Edition - Pathologic stage from 06/12/2021: Stage Unknown (pTis (DCIS), pNX, G3, ER+, PR-, HER2-) - Signed by Truitt Merle, MD on 08/16/2021 Stage prefix: Initial diagnosis Histologic grading system: 3 grade system Residual tumor (R): R0 - None   06/12/2021 Pathology Results   FINAL MICROSCOPIC DIAGNOSIS:   A. BREAST, LEFT, LUMPECTOMY:  - Ductal carcinoma in situ, 4.8 cm.  - Margins not involved.  - DCIS focally 0.1 cm from posterior margin and 0.5 cm from medial margin.  - Biopsy site and biopsy clip.    06/12/2021 Surgery   Left lumpectomy   07/18/2021 - 08/14/2021 Radiation Therapy   Left breast radiation, under Dr. Isidore Moos   08/16/2021 - 08/16/2026 Anti-estrogen oral therapy   Tamoxifen to be taken daily for 5 years   11/18/2021 Survivorship   SCP delivered by Cira Rue, NP      INTERVAL HISTORY:  Destiny Little is here for a follow up of left breast DCIS  She was last seen by me on 08/19/2022. She presents to the clinic interpreter. Pt state that she has been having the blues. She states that her mood has impacted her daily life Pt state that she has some hot flashes. Pt state that she some of the mood comes  from Tamoxifen , but also personal life.   All other systems were reviewed with the patient and are negative.  MEDICAL HISTORY:  Past Medical History:  Diagnosis Date   Anxiety    Back pain    Breast cancer ([place])    Chest pain    Constipation    Edema of both lower extremities    History of kidney stones    Hypertension    Joint pain    Migraines    Personal history of radiation therapy 07/2021   Pre-diabetes    SOB (shortness of breath)    Vitamin D  deficiency     SURGICAL HISTORY: Past Surgical History:  Procedure Laterality Date   ABDOMINAL HYSTERECTOMY     BREAST LUMPECTOMY Left 05/2021   BREAST LUMPECTOMY WITH RADIOACTIVE SEED LOCALIZATION Left 06/12/2021   Procedure: LEFT BREAST LUMPECTOMY WITH RADIOACTIVE SEED LOCALIZATION X 2;  Surgeon: Stark Klein, MD;  Location: [facility];  Service: General;  Laterality: Left;  73 MINUTES ROOM 3   CESAREAN SECTION     x3   gastic bypass      I have reviewed the social history and family history with the patient and they are unchanged from previous note.  ALLERGIES:  has No Known Allergies.  MEDICATIONS:  Current Outpatient Medications  Medication Sig Dispense Refill   Cholecalciferol (VITAMIN D3) 1.25 MG (50000 UT) CAPS Take 1 capsule by mouth.     escitalopram (LEXAPRO) 10 MG tablet Take 1 tablet (10 mg total) by mouth daily. 30 tablet 0   hyaluronate sodium (RADIAPLEXRX) GEL Apply 1 application topically 2 (two) times daily. 170 g 0   ibuprofen (ADVIL) 200 MG tablet Take 200 mg by mouth every 6 (six) hours as needed for moderate pain.     losartan-hydrochlorothiazide (HYZAAR) 50-12.5 MG tablet Take 1 tablet by mouth daily.     No current facility-administered medications for this visit.    PHYSICAL EXAMINATION: ECOG PERFORMANCE STATUS: 0 - Asymptomatic  Vitals:   03/06/23 0851  BP: 125/77  Pulse: 72  Resp: 17  Temp: 97.9 F (36.6 C)  SpO2: 99%   Wt Readings from Last 3 Encounters:  03/06/23 253 lb 11.2 oz (115.1 kg)  11/11/22 238 lb (108 kg)  10/28/22 241 lb (109.3 kg)     NECK:(-) supple, thyroid normal size, non-tender, without nodularity LYMPH:(-)   no palpable lymphadenopathy in the cervical, axillary   BREAST: Rt breast no palpable mass. LT breast a little dark from previous radiation. Breast  firm from lymphedema, no palpable mass breast exam benign.     LABORATORY DATA:  I have reviewed the data as listed    Latest Ref Rng & Units 03/06/2023    8:28 AM  08/19/2022    7:50 AM 02/07/2022    2:47 PM  CBC  WBC 4.0 - 10.5 K/uL 6.3  5.5  7.2   Hemoglobin 12.0 - 15.0 g/dL 12.9  12.8  12.2   Hematocrit 36.0 - 46.0 % 38.4  37.5  36.6   Platelets 150 - 400 K/uL 354  359  379         Latest Ref Rng & Units 03/06/2023    8:28 AM 10/28/2022    8:20 AM 08/19/2022    7:50 AM  CMP  Glucose 70 - 99 mg/dL 97  94  102   BUN 6 - 20 mg/dL 24  14  20    Creatinine 0.44 - 1.00 mg/dL 0.63  0.69  0.68  Sodium 135 - 145 mmol/L 140  141  139   Potassium 3.5 - 5.1 mmol/L 4.1  4.7  4.2   Chloride 98 - 111 mmol/L 106  106  105   CO2 22 - 32 mmol/L 31  22  28    Calcium 8.9 - 10.3 mg/dL 9.3  9.5  9.4   Total Protein 6.5 - 8.1 g/dL 7.1  6.7  6.9   Total Bilirubin 0.3 - 1.2 mg/dL 0.4  0.4  0.3   Alkaline Phos 38 - 126 U/L 57  59  51   AST 15 - 41 U/L 16  19  16    ALT 0 - 44 U/L 12  15  11        RADIOGRAPHIC STUDIES: I have personally reviewed the radiological images as listed and agreed with the findings in the report. No results found.    Orders Placed This Encounter  Procedures   MM 3D DIAGNOSTIC MAMMOGRAM BILATERAL BREAST    Standing Status:   Future    Standing Expiration Date:   03/05/2024    Order Specific Question:   Reason for Exam (SYMPTOM  OR DIAGNOSIS REQUIRED)    Answer:   screening    Order Specific Question:   Preferred imaging location?    Answer:   [facility]    Order Specific Question:   Is the patient pregnant?    Answer:   No   All questions were answered. The patient knows to call the clinic with any problems, questions or concerns. No barriers to learning was detected. The total time spent in the appointment was 30 minutes.     Truitt Merle, MD 03/06/2023   Felicity Coyer, CMA, am acting as scribe for Truitt Merle, MD.   I have reviewed the above documentation for accuracy and completeness, and I agree with the above.

## 2023-03-09 ENCOUNTER — Other Ambulatory Visit: Payer: Self-pay | Admitting: Hematology

## 2023-03-09 DIAGNOSIS — D0512 Intraductal carcinoma in situ of left breast: Secondary | ICD-10-CM

## 2023-05-05 ENCOUNTER — Ambulatory Visit
Admission: RE | Admit: 2023-05-05 | Discharge: 2023-05-05 | Disposition: A | Discharge status: Home / Self Care | Payer: BC Managed Care – PPO | Source: Ambulatory Visit | Attending: Hematology | Admitting: Hematology

## 2023-05-05 DIAGNOSIS — D0512 Intraductal carcinoma in situ of left breast: Secondary | ICD-10-CM

## 2023-08-27 ENCOUNTER — Inpatient Hospital Stay: Payer: BC Managed Care – PPO | Attending: Nurse Practitioner | Admitting: Nurse Practitioner

## 2023-08-27 ENCOUNTER — Encounter: Payer: Self-pay | Admitting: Nurse Practitioner

## 2023-08-27 VITALS — BP 130/63 | HR 76 | Temp 97.9°F | Resp 18 | Ht 62.0 in | Wt 264.7 lb

## 2023-08-27 DIAGNOSIS — Z170 Estrogen receptor positive status [ER+]: Secondary | ICD-10-CM | POA: Insufficient documentation

## 2023-08-27 DIAGNOSIS — D0512 Intraductal carcinoma in situ of left breast: Secondary | ICD-10-CM | POA: Diagnosis present

## 2023-08-27 NOTE — Progress Notes (Signed)
Patient Care Team: Norm Salt, [place] as PCP - General (Physician Assistant) Malachy Mood, MD as Consulting Physician (Hematology) Almond Lint, MD as Consulting Physician (General Surgery) Lonie Peak, MD as Attending Physician (Radiation Oncology) Pollyann Samples, NP as Nurse Practitioner (Nurse Practitioner) Maryclare Labrador, RN as Registered Nurse   CHIEF COMPLAINT: Follow-up left breast DCIS  Oncology History Overview Note  Cancer Staging Ductal carcinoma in situ (DCIS) of left breast Staging form: Breast, AJCC 8th Edition - Clinical stage from 05/15/2021: Stage 0 (cTis (DCIS), cN0, cM0, G3, ER+, PR-, HER2: Not Assessed) - Signed by Malachy Mood, MD on 05/21/2021 Stage prefix: Initial diagnosis Histologic grading system: 3 grade system - Pathologic stage from 06/12/2021: Stage Unknown (pTis (DCIS), pNX, G3, ER+, PR-, HER2-) - Signed by Malachy Mood, MD on 08/16/2021 Stage prefix: Initial diagnosis Histologic grading system: 3 grade system Residual tumor (R): R0 - None    Ductal carcinoma in situ (DCIS) of left breast  05/07/2021 Mammogram   IMPRESSION: 1. There is a 5 cm group of suspicious calcifications in the lower-inner quadrant of the left breast.   2. There are 2 small adjacent groups of calcifications in the upper-outer quadrant of the right breast measuring 0.6 and 0.5 cm respectively, which may represent fibrocystic changes.   05/15/2021 Initial Biopsy   Diagnosis 1. Breast, left, needle core biopsy, left breast LIQ anterior - DUCTAL CARCINOMA IN SITU WITH CALCIFICATIONS AND NECROSIS - SEE COMMENT 2. Breast, left, needle core biopsy, left breast LIQ posterior - DUCTAL CARCINOMA IN SITU WITH CALCIFICATIONS AND NECROSIS - SEE COMMENT Microscopic Comment 1. and 2. Based on the biopsy, the ductal carcinoma in situ has a comedo pattern, high nuclear grade and measures 0.2 cm in greatest linear extent. Prognostic markers (ER/PR) are pending and will be reported in an  addendum. Dr. Luisa Hart reviewed the case and agrees with the above diagnosis. These results were called to [facility] on May 16, 2021.   05/15/2021 Cancer Staging   Staging form: Breast, AJCC 8th Edition - Clinical stage from 05/15/2021: Stage 0 (cTis (DCIS), cN0, cM0, G3, ER+, PR-, HER2: Not Assessed) - Signed by Malachy Mood, MD on 05/21/2021 Stage prefix: Initial diagnosis Histologic grading system: 3 grade system   05/15/2021 Receptors her2   ADDITIONAL INFORMATION: 1. PROGNOSTIC INDICATORS Results: IMMUNOHISTOCHEMICAL AND MORPHOMETRIC ANALYSIS PERFORMED MANUALLY Estrogen Receptor: 30%, POSITIVE, WEAK STAINING INTENSITY Progesterone Receptor: 0%, NEGATIVE   05/16/2021 Initial Diagnosis   Ductal carcinoma in situ (DCIS) of left breast   05/20/2021 Imaging   MRI breast IMPRESSION: Non masslike enhancement in the anterior aspect of the left breast consistent with post biopsy change/hematoma. No additional abnormal enhancement in the left breast is identified. No abnormal enhancement in the right breast.   05/23/2021 Pathology Results   Diagnosis 1. Breast, right, needle core biopsy, upper outer posterior (x clip) - COLUMNAR CELL AND FIBROCYSTIC CHANGES WITH APOCRINE METAPLASIA AND CALCIFICATIONS - NO MALIGNANCY IDENTIFIED 2. Breast, right, needle core biopsy, upper outer anterior (ribbon clip) - COLUMNAR CELL AND FIBROCYSTIC CHANGES WITH APOCRINE METAPLASIA AND CALCIFICATIONS - PSEUDOANGIOMATOUS STROMAL HYPERPLASIA - NO MALIGNANCY IDENTIFIED   05/27/2021 Genetic Testing   Negative hereditary cancer genetic testing: no pathogenic variants detected in [facility] BRCAPlus Panel or CancerNext-Expanded +RNAinsight Panel.  The report dates are May 27, 2021 and June 02, 2021, respectively.   The BRCAplus panel offered by [employer] and includes sequencing and deletion/duplication analysis for the following 8 genes: ATM, BRCA1, BRCA2,  CDH1, CHEK2, PALB2, PTEN, and TP53.  The  CancerNext-Expanded gene panel offered by [facility] and includes sequencing, rearrangement, and RNA analysis for the following 77 genes: AIP, ALK, APC, ATM, AXIN2, BAP1, BARD1, BLM, BMPR1A, BRCA1, BRCA2, BRIP1, CDC73, CDH1, CDK4, CDKN1B, CDKN2A, CHEK2, CTNNA1, DICER1, FANCC, FH, FLCN, GALNT12, KIF1B, LZTR1, MAX, MEN1, MET, MLH1, MSH2, MSH3, MSH6, MUTYH, NBN, NF1, NF2, NTHL1, PALB2, PHOX2B, PMS2, POT1, PRKAR1A, PTCH1, PTEN, RAD51C, RAD51D, RB1, RECQL, RET, SDHA, SDHAF2, SDHB, SDHC, SDHD, SMAD4, SMARCA4, SMARCB1, SMARCE1, STK11, SUFU, TMEM127, TP53, TSC1, TSC2, VHL and XRCC2 (sequencing and deletion/duplication); EGFR, EGLN1, HOXB13, KIT, MITF, PDGFRA, POLD1, and POLE (sequencing only); EPCAM and GREM1 (deletion/duplication only).    06/12/2021 Pathology Results   FINAL MICROSCOPIC DIAGNOSIS:   A. BREAST, LEFT, LUMPECTOMY:  - Ductal carcinoma in situ, 4.8 cm.  - Margins not involved.  - DCIS focally 0.1 cm from posterior margin and 0.5 cm from medial margin.  - Biopsy site and biopsy clip.  - See oncology table.    06/12/2021 Cancer Staging   Staging form: Breast, AJCC 8th Edition - Pathologic stage from 06/12/2021: Stage Unknown (pTis (DCIS), pNX, G3, ER+, PR-, HER2-) - Signed by Malachy Mood, MD on 08/16/2021 Stage prefix: Initial diagnosis Histologic grading system: 3 grade system Residual tumor (R): R0 - None   06/12/2021 Pathology Results   FINAL MICROSCOPIC DIAGNOSIS:   A. BREAST, LEFT, LUMPECTOMY:  - Ductal carcinoma in situ, 4.8 cm.  - Margins not involved.  - DCIS focally 0.1 cm from posterior margin and 0.5 cm from medial margin.  - Biopsy site and biopsy clip.    06/12/2021 Surgery   Left lumpectomy   07/18/2021 - 08/14/2021 Radiation Therapy   Left breast radiation, under Dr. Basilio Cairo   08/16/2021 - 08/16/2026 Anti-estrogen oral therapy   Tamoxifen to be taken daily for 5 years   11/18/2021 Survivorship   SCP delivered by Santiago Glad, NP      CURRENT THERAPY:  -Tamoxifen  starting 08/2021, reduced to 10 mg since 08/2022 - stopped 02/2023 due to SE's and small benefit.  -Currently on surveillance   INTERVAL HISTORY Destiny Little here with interpreter for follow-up as scheduled, last seen by Dr. Mosetta Putt 03/06/2023.  Mammogram 05/05/2023 was negative. Feeling better off Tamoxifen, less anxiety and hot flashes. She completed high dose vit D. Will see PCP soon. Denies concerns in her breasts.   ROS  All other systems reviewed and negative  Past Medical History:  Diagnosis Date   Anxiety    Back pain    Breast cancer (HCC)    Chest pain    Constipation    Edema of both lower extremities    History of kidney stones    Hypertension    Joint pain    Migraines    Personal history of radiation therapy 07/2021   Pre-diabetes    SOB (shortness of breath)    Vitamin D deficiency      Past Surgical History:  Procedure Laterality Date   ABDOMINAL HYSTERECTOMY     BREAST LUMPECTOMY Left 05/2021   BREAST LUMPECTOMY WITH RADIOACTIVE SEED LOCALIZATION Left 06/12/2021   Procedure: LEFT BREAST LUMPECTOMY WITH RADIOACTIVE SEED LOCALIZATION X 2;  Surgeon: Almond Lint, MD;  Location: [facility] OR;  Service: General;  Laterality: Left;  60 MINUTES ROOM 3   CESAREAN SECTION     x3   gastic bypass       Outpatient Encounter Medications as of 08/27/2023  Medication Sig   Cholecalciferol (  VITAMIN D3) 1.25 MG (50000 UT) CAPS Take 1 capsule by mouth.   ibuprofen (ADVIL) 200 MG tablet Take 200 mg by mouth every 6 (six) hours as needed for moderate pain.   losartan-hydrochlorothiazide (HYZAAR) 50-12.5 MG tablet Take 1 tablet by mouth daily.   [DISCONTINUED] escitalopram (LEXAPRO) 10 MG tablet Take 1 tablet (10 mg total) by mouth daily.   [DISCONTINUED] hyaluronate sodium (RADIAPLEXRX) GEL Apply 1 application topically 2 (two) times daily.   [DISCONTINUED] losartan-hydrochlorothiazide (HYZAAR) 50-12.5 MG tablet Take 1 tablet by mouth daily.   No facility-administered encounter  medications on file as of 08/27/2023.     Today's Vitals   08/27/23 1157 08/27/23 1159  BP: 130/63   Pulse: 76   Resp: 18   Temp: 97.9 F (36.6 C)   TempSrc: Oral   SpO2: 100%   Weight: 264 lb 11.2 oz (120.1 kg)   Height: 5\' 2"  (1.575 m)   PainSc:  0-No pain   Body mass index is 48.41 kg/m.   PHYSICAL EXAM GENERAL:alert, no distress and comfortable SKIN: no rash  EYES: sclera clear NECK: without mass LYMPH:  no palpable cervical or supraclavicular lymphadenopathy  LUNGS:  normal breathing effort HEART:  no lower extremity edema ABDOMEN: abdomen soft, non-tender and normal bowel sounds NEURO: alert & oriented x 3 with fluent speech, no focal motor/sensory deficits Breast exam: Symmetrical without nipple discharge or inversion.  S/p left lumpectomy, incisions completely healed.  No palpable mass or nodularity in either breast or axilla that I could appreciate   CBC    Component Value Date/Time   WBC 6.3 03/06/2023 0828   WBC 8.0 06/06/2021 1355   RBC 4.13 03/06/2023 0828   HGB 12.9 03/06/2023 0828   HCT 38.4 03/06/2023 0828   PLT 354 03/06/2023 0828   MCV 93.0 03/06/2023 0828   MCH 31.2 03/06/2023 0828   MCHC 33.6 03/06/2023 0828   RDW 12.7 03/06/2023 0828   LYMPHSABS 3.3 03/06/2023 0828   MONOABS 0.5 03/06/2023 0828   EOSABS 0.1 03/06/2023 0828   BASOSABS 0.0 03/06/2023 0828     CMP     Component Value Date/Time   NA 140 03/06/2023 0828   NA 141 10/28/2022 0820   K 4.1 03/06/2023 0828   CL 106 03/06/2023 0828   CO2 31 03/06/2023 0828   GLUCOSE 97 03/06/2023 0828   BUN 24 (H) 03/06/2023 0828   BUN 14 10/28/2022 0820   CREATININE 0.63 03/06/2023 0828   CALCIUM 9.3 03/06/2023 0828   PROT 7.1 03/06/2023 0828   PROT 6.7 10/28/2022 0820   ALBUMIN 4.0 03/06/2023 0828   ALBUMIN 3.9 10/28/2022 0820   AST 16 03/06/2023 0828   ALT 12 03/06/2023 0828   ALKPHOS 57 03/06/2023 0828   BILITOT 0.4 03/06/2023 0828   GFRNONAA >60 03/06/2023 1610     ASSESSMENT &  PLAN:Destiny Little is a 52 y.o. 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 S/p left lumpectomy on 06/12/21 with Dr. Donell Beers and adjuvant radiation under Dr. Basilio Cairo from 07/18/21 - 08/14/21. -she started tamoxifen around late September/early October 2022. Due to hot flushes, I reduced dose to 10mg  daily, plan for a total of 3 years, eventually stopped 02/2023 due to side effects and small benefit -Screening breast MRI in 10/2022 and mammogram 04/2023 were benign  -Ms. Betances to [facility] is clinically doing well.  Exam is benign.  Overall no clinical concern  for recurrence -Continue surveillance with annual mammogram and screening breast MRI, 6 months apart. -F/up in 1 year, or sooner if needed    PLAN: -Continue breast cancer surveillance -Screening MRI 10/2023, Mammo 04/2024 -Lab and f/up in 1 year, or sooner if needed -Can switch to OTC vit D 3000 international units daily until next PCP lab/visit   Orders Placed This Encounter  Procedures   MR BREAST BILATERAL W WO CONTRAST INC CAD    Standing Status:   Future    Standing Expiration Date:   08/26/2024    Order Specific Question:   If indicated for the ordered procedure, I authorize the administration of contrast media per Radiology protocol    Answer:   Yes    Order Specific Question:   What is the patient's sedation requirement?    Answer:   No Sedation    Order Specific Question:   Does the patient have a pacemaker or implanted devices?    Answer:   No    Order Specific Question:   Preferred imaging location?    Answer:   [facility] (table limit-550lbs)      All questions were answered. The patient knows to call the clinic with any problems, questions or concerns. No barriers to learning were detected with assistance from Spanish speaking interpreter.   Santiago Glad, NP-C 08/27/2023

## 2023-09-28 ENCOUNTER — Encounter: Payer: Self-pay | Admitting: Nurse Practitioner

## 2023-10-07 ENCOUNTER — Ambulatory Visit
Admission: RE | Admit: 2023-10-07 | Discharge: 2023-10-07 | Disposition: A | Discharge status: Home / Self Care | Payer: BC Managed Care – PPO | Source: Ambulatory Visit | Attending: Nurse Practitioner | Admitting: Nurse Practitioner

## 2023-10-07 DIAGNOSIS — D0512 Intraductal carcinoma in situ of left breast: Secondary | ICD-10-CM

## 2023-10-07 MED ORDER — GADOPICLENOL 0.5 MMOL/ML IV SOLN
10.0000 mL | Freq: Once | INTRAVENOUS | Status: AC | PRN
  Administered 2023-10-07: 10 mL via INTRAVENOUS

## 2023-10-08 ENCOUNTER — Other Ambulatory Visit: Payer: Self-pay | Admitting: Nurse Practitioner

## 2023-10-08 DIAGNOSIS — D0512 Intraductal carcinoma in situ of left breast: Secondary | ICD-10-CM

## 2024-05-13 ENCOUNTER — Ambulatory Visit
Admission: RE | Admit: 2024-05-13 | Discharge: 2024-05-13 | Disposition: A | Discharge status: Home / Self Care | Payer: Self-pay | Source: Ambulatory Visit | Attending: Nurse Practitioner | Admitting: Nurse Practitioner

## 2024-05-13 DIAGNOSIS — D0512 Intraductal carcinoma in situ of left breast: Secondary | ICD-10-CM

## 2024-08-18 ENCOUNTER — Encounter: Payer: Self-pay | Admitting: *Deleted

## 2024-08-18 NOTE — Telephone Encounter (Signed)
 Wrong patient chart opened.  Disregard in error.         Clinical - Red Word Triage >> Aug 18, 2024 10:28 AM Gennette ORN wrote: Patient is having severe headaches after she went to urgent care yesterday she also had high blood pressure yesterday as well but that's okay today. Answer Assessment - Initial Assessment Questions 1. BLOOD PRESSURE: What is your blood pressure? Did you take at least two measurements 5 minutes apart?     Does not have monitor with her now  2. ONSET: When did you take your blood pressure?     *No Answer* 3. HOW: How did you take your blood pressure? (e.g., automatic home BP monitor, visiting nurse)     *No Answer* 4. HISTORY: Do you have a history of high blood pressure?     No  5. MEDICINES: Are you taking any medicines for blood pressure? Have you missed any doses recently?     *No Answer* 6. OTHER SYMPTOMS: Do you have any symptoms? (e.g., blurred vision, chest pain, difficulty breathing, headache, weakness)     Headache frontal area, 8/10  7. PREGNANCY: Is there any chance you are pregnant? When was your last menstrual period?     na  Protocols used: Blood Pressure - High-A-AH  This encounter was created in error - please disregard.

## 2024-08-23 ENCOUNTER — Other Ambulatory Visit: Payer: Self-pay | Admitting: Nurse Practitioner

## 2024-08-23 DIAGNOSIS — D0512 Intraductal carcinoma in situ of left breast: Secondary | ICD-10-CM

## 2024-08-23 NOTE — Progress Notes (Deleted)
 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 S/p left lumpectomy on 06/12/21 with Dr. Aron. 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 Margins not involved. -She received adjuvant radiation under Dr. Izell from 07/18/21 - 08/14/21. -she started tamoxifen around late September/early October 2022. Due to hot flushes, I reduced dose to 10mg  daily, plan for a total of 3 years -last mammogram 04/16/22 was benign, screening breast MRI in November 2023 was also benign. -she will continue annual mammogram and screening breast MRI, 6 months apart.    The patient understands the plans discussed today and is in agreement with them.  She knows to contact our office if she develops concerns prior to her next appointment.  I provided *** minutes of face-to-face time during this encounter and > 50% was spent counseling as documented under my assessment and plan.    Powell FORBES Lessen, NP  [facility] - A DEPT OF [facility] [address] [place] [address] Dept: [phone] Dept Fax: [phone]   No orders of the defined types were placed in this encounter.     CHIEF COMPLAINT:  CC: Ductal carcinoma in situ of left breast  Current Treatment: Tamoxifen (started 08/16/2021) Goal of 5 years  INTERVAL HISTORY:  Destiny Little is here today for repeat clinical assessment.  She last saw Lacie, NP on 08/27/2023.  She  had 3D diagnostic mammogram on 05/13/2024.  Results were benign.  She is due for breast MRI in October 2025.  She denies fevers or chills. She denies pain. Her appetite is good. Her weight {Weight change:10426}.  I have reviewed the past medical history, past surgical history, social history and family history with the patient and they are unchanged from previous note.  ALLERGIES:  has no known allergies.  MEDICATIONS:  Current Outpatient Medications  Medication Sig Dispense Refill   Cholecalciferol (VITAMIN D3) 1.25 MG (50000 UT) CAPS Take 1 capsule by mouth.     ibuprofen (ADVIL) 200 MG tablet Take 200 mg by mouth every 6 (six) hours as needed for moderate pain.     losartan-hydrochlorothiazide (HYZAAR) 50-12.5 MG tablet Take 1 tablet by mouth daily.     No current facility-administered medications for this visit.    HISTORY OF PRESENT ILLNESS:   Oncology History Overview Note  Cancer Staging Ductal carcinoma in situ (DCIS) of left breast Staging form: Breast, AJCC 8th Edition - Clinical stage from 05/15/2021: Stage 0 (cTis (DCIS), cN0, cM0, G3, ER+, PR-, HER2: Not Assessed) - Signed by Lanny Callander, MD on 05/21/2021 Stage prefix: Initial diagnosis Histologic grading system: 3 grade system - Pathologic stage from 06/12/2021: Stage Unknown (pTis (DCIS), pNX, G3, ER+, PR-, HER2-) - Signed by Lanny Callander, MD on 08/16/2021 Stage prefix: Initial diagnosis Histologic grading system: 3 grade system Residual tumor (R): R0 - None    Ductal carcinoma in situ (DCIS) of left breast  05/07/2021 Mammogram   IMPRESSION: 1. There is a 5 cm group of  suspicious calcifications in the lower-inner quadrant of the left breast.   2. There are 2 small adjacent groups of calcifications in the upper-outer quadrant of the right breast measuring 0.6 and 0.5 cm respectively, which may represent fibrocystic changes.   05/15/2021 Initial Biopsy   Diagnosis 1. Breast, left, needle core biopsy, left breast LIQ  anterior - DUCTAL CARCINOMA IN SITU WITH CALCIFICATIONS AND NECROSIS - SEE COMMENT 2. Breast, left, needle core biopsy, left breast LIQ posterior - DUCTAL CARCINOMA IN SITU WITH CALCIFICATIONS AND NECROSIS - SEE COMMENT Microscopic Comment 1. and 2. Based on the biopsy, the ductal carcinoma in situ has a comedo pattern, high nuclear grade and measures 0.2 cm in greatest linear extent. Prognostic markers (ER/PR) are pending and will be reported in an addendum. Dr. Belvie reviewed the case and agrees with the above diagnosis. These results were called to [facility] on May 16, 2021.   05/15/2021 Cancer Staging   Staging form: Breast, AJCC 8th Edition - Clinical stage from 05/15/2021: Stage 0 (cTis (DCIS), cN0, cM0, G3, ER+, PR-, HER2: Not Assessed) - Signed by Lanny Callander, MD on 05/21/2021 Stage prefix: Initial diagnosis Histologic grading system: 3 grade system   05/15/2021 Receptors her2   ADDITIONAL INFORMATION: 1. PROGNOSTIC INDICATORS Results: IMMUNOHISTOCHEMICAL AND MORPHOMETRIC ANALYSIS PERFORMED MANUALLY Estrogen Receptor: 30%, POSITIVE, WEAK STAINING INTENSITY Progesterone Receptor: 0%, NEGATIVE   05/16/2021 Initial Diagnosis   Ductal carcinoma in situ (DCIS) of left breast   05/20/2021 Imaging   MRI breast IMPRESSION: Non masslike enhancement in the anterior aspect of the left breast consistent with post biopsy change/hematoma. No additional abnormal enhancement in the left breast is identified. No abnormal enhancement in the right breast.   05/23/2021 Pathology Results   Diagnosis 1. Breast, right, needle core biopsy, upper outer posterior (x clip) - COLUMNAR CELL AND FIBROCYSTIC CHANGES WITH APOCRINE METAPLASIA AND CALCIFICATIONS - NO MALIGNANCY IDENTIFIED 2. Breast, right, needle core biopsy, upper outer anterior (ribbon clip) - COLUMNAR CELL AND FIBROCYSTIC CHANGES WITH APOCRINE METAPLASIA AND CALCIFICATIONS - PSEUDOANGIOMATOUS STROMAL HYPERPLASIA - NO  MALIGNANCY IDENTIFIED   05/27/2021 Genetic Testing   Negative hereditary cancer genetic testing: no pathogenic variants detected in [facility] BRCAPlus Panel or CancerNext-Expanded +RNAinsight Panel.  The report dates are May 27, 2021 and June 02, 2021, respectively.   The BRCAplus panel offered by [employer] and includes sequencing and deletion/duplication analysis for the following 8 genes: ATM, BRCA1, BRCA2, CDH1, CHEK2, PALB2, PTEN, and TP53.  The CancerNext-Expanded gene panel offered by [facility] and includes sequencing, rearrangement, and RNA analysis for the following 77 genes: AIP, ALK, APC, ATM, AXIN2, BAP1, BARD1, BLM, BMPR1A, BRCA1, BRCA2, BRIP1, CDC73, CDH1, CDK4, CDKN1B, CDKN2A, CHEK2, CTNNA1, DICER1, FANCC, FH, FLCN, GALNT12, KIF1B, LZTR1, MAX, MEN1, MET, MLH1, MSH2, MSH3, MSH6, MUTYH, NBN, NF1, NF2, NTHL1, PALB2, PHOX2B, PMS2, POT1, PRKAR1A, PTCH1, PTEN, RAD51C, RAD51D, RB1, RECQL, RET, SDHA, SDHAF2, SDHB, SDHC, SDHD, SMAD4, SMARCA4, SMARCB1, SMARCE1, STK11, SUFU, TMEM127, TP53, TSC1, TSC2, VHL and XRCC2 (sequencing and deletion/duplication); EGFR, EGLN1, HOXB13, KIT, MITF, PDGFRA, POLD1, and POLE (sequencing only); EPCAM and GREM1 (deletion/duplication only).    06/12/2021 Pathology Results   FINAL MICROSCOPIC DIAGNOSIS:   A. BREAST, LEFT, LUMPECTOMY:  - Ductal carcinoma in situ, 4.8 cm.  - Margins not involved.  - DCIS focally 0.1 cm from posterior margin and 0.5 cm from medial margin.  - Biopsy site and biopsy clip.  - See oncology table.    06/12/2021 Cancer Staging   Staging form:  Breast, AJCC 8th Edition - Pathologic stage from 06/12/2021: Stage Unknown (pTis (DCIS), pNX, G3, ER+, PR-, HER2-) - Signed by Lanny Callander, MD on 08/16/2021 Stage prefix: Initial diagnosis Histologic grading system: 3 grade system Residual tumor (R): R0 - None   06/12/2021 Pathology Results   FINAL MICROSCOPIC DIAGNOSIS:   A. BREAST, LEFT, LUMPECTOMY:  - Ductal carcinoma in situ, 4.8 cm.  -  Margins not involved.  - DCIS focally 0.1 cm from posterior margin and 0.5 cm from medial margin.  - Biopsy site and biopsy clip.    06/12/2021 Surgery   Left lumpectomy   07/18/2021 - 08/14/2021 Radiation Therapy   Left breast radiation, under Dr. Izell   08/16/2021 - 08/16/2026 Anti-estrogen oral therapy   Tamoxifen to be taken daily for 5 years   11/18/2021 Survivorship   SCP delivered by Lacie Burton, NP   05/13/2024 Mammogram   3D diagnostic mammogram  IMPRESSION: Stable post treatment changes of the left breast. No mammographic evidence of malignancy in either breast.  RECOMMENDATION: Routine annual screening mammogram in 1 year. BI-RADS CATEGORY  2: Benign.        REVIEW OF SYSTEMS:   Constitutional: Denies fevers, chills or abnormal weight loss Eyes: Denies blurriness of vision Ears, nose, mouth, throat, and face: Denies mucositis or sore throat Respiratory: Denies cough, dyspnea or wheezes Cardiovascular: Denies palpitation, chest discomfort or lower extremity swelling Gastrointestinal:  Denies nausea, heartburn or change in bowel habits Skin: Denies abnormal skin rashes Lymphatics: Denies new lymphadenopathy or easy bruising Neurological:Denies numbness, tingling or new weaknesses Behavioral/Psych: Mood is stable, no new changes  All other systems were reviewed with the patient and are negative.   VITALS:  There were no vitals taken for this visit.  Wt Readings from Last 3 Encounters:  08/27/23 264 lb 11.2 oz (120.1 kg)  03/06/23 253 lb 11.2 oz (115.1 kg)  11/11/22 238 lb (108 kg)    There is no height or weight on file to calculate BMI.  Performance status (ECOG): {CHL ONC [id number]  PHYSICAL EXAM:   GENERAL:alert, no distress and comfortable SKIN: skin color, texture, turgor are normal, no rashes or significant lesions EYES: normal, Conjunctiva are pink and non-injected, sclera clear OROPHARYNX:no exudate, no erythema and lips, buccal mucosa, and  tongue normal  NECK: supple, thyroid normal size, non-tender, without nodularity LYMPH:  no palpable lymphadenopathy in the cervical, axillary or inguinal LUNGS: clear to auscultation and percussion with normal breathing effort HEART: regular rate & rhythm and no murmurs and no lower extremity edema ABDOMEN:abdomen soft, non-tender and normal bowel sounds Musculoskeletal:no cyanosis of digits and no clubbing  NEURO: alert & oriented x 3 with fluent speech, no focal motor/sensory deficits  LABORATORY DATA:  I have reviewed the data as listed    Component Value Date/Time   NA 140 03/06/2023 0828   NA 141 10/28/2022 0820   K 4.1 03/06/2023 0828   CL 106 03/06/2023 0828   CO2 31 03/06/2023 0828   GLUCOSE 97 03/06/2023 0828   BUN 24 (H) 03/06/2023 0828   BUN 14 10/28/2022 0820   CREATININE 0.63 03/06/2023 0828   CALCIUM 9.3 03/06/2023 0828   PROT 7.1 03/06/2023 0828   PROT 6.7 10/28/2022 0820   ALBUMIN 4.0 03/06/2023 0828   ALBUMIN 3.9 10/28/2022 0820   AST 16 03/06/2023 0828   ALT 12 03/06/2023 0828   ALKPHOS 57 03/06/2023 0828   BILITOT 0.4 03/06/2023 0828   GFRNONAA >60 03/06/2023 0828    No results  found for: SPEP, UPEP  Lab Results  Component Value Date   WBC 6.3 03/06/2023   NEUTROABS 2.4 03/06/2023   HGB 12.9 03/06/2023   HCT 38.4 03/06/2023   MCV 93.0 03/06/2023   PLT 354 03/06/2023      Chemistry      Component Value Date/Time   NA 140 03/06/2023 0828   NA 141 10/28/2022 0820   K 4.1 03/06/2023 0828   CL 106 03/06/2023 0828   CO2 31 03/06/2023 0828   BUN 24 (H) 03/06/2023 0828   BUN 14 10/28/2022 0820   CREATININE 0.63 03/06/2023 0828      Component Value Date/Time   CALCIUM 9.3 03/06/2023 0828   ALKPHOS 57 03/06/2023 0828   AST 16 03/06/2023 0828   ALT 12 03/06/2023 0828   BILITOT 0.4 03/06/2023 0828       RADIOGRAPHIC STUDIES: I have personally reviewed the radiological images as listed and agreed with the findings in the report. No  results found.

## 2024-08-23 NOTE — Assessment & Plan Note (Deleted)
grade III, ER weakly+/PR-  -seen on screening mammogram. S/p left lumpectomy on 06/12/21 with Dr. Barry Dienes. 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 Margins not involved. -She received adjuvant radiation under Dr. Isidore Moos from 07/18/21 - 08/14/21. -she started tamoxifen around late September/early October 2022. Due to hot flushes, I reduced dose to 10mg  daily, plan for a total of 3 years -last mammogram 04/16/22 was benign, screening breast MRI in November 2023 was also benign. -she will continue annual mammogram and screening breast MRI, 6 months apart.

## 2024-08-24 ENCOUNTER — Telehealth: Payer: Self-pay

## 2024-08-24 ENCOUNTER — Inpatient Hospital Stay: Payer: Self-pay | Admitting: Nurse Practitioner

## 2024-08-24 ENCOUNTER — Inpatient Hospital Stay: Payer: Self-pay | Attending: Nurse Practitioner

## 2024-08-24 ENCOUNTER — Other Ambulatory Visit: Payer: Self-pay

## 2024-08-24 DIAGNOSIS — D0512 Intraductal carcinoma in situ of left breast: Secondary | ICD-10-CM

## 2024-08-24 DIAGNOSIS — Z79810 Long term (current) use of selective estrogen receptor modulators (SERMs): Secondary | ICD-10-CM | POA: Insufficient documentation

## 2024-08-24 NOTE — Telephone Encounter (Signed)
 Called patient due to not showing up for her 0900 lab app or her 0930 app with Powell Lessen NP. No answer to phone call unable to leave a message will no show patient for her appointments today and send a message to scheduling to have her rescheduled.

## 2024-08-26 ENCOUNTER — Ambulatory Visit: Payer: Self-pay | Admitting: Nurse Practitioner

## 2024-08-26 ENCOUNTER — Other Ambulatory Visit: Payer: BC Managed Care – PPO

## 2024-08-26 ENCOUNTER — Other Ambulatory Visit: Payer: Self-pay

## 2024-08-26 ENCOUNTER — Ambulatory Visit: Payer: BC Managed Care – PPO | Admitting: Nurse Practitioner

## 2024-09-01 NOTE — Progress Notes (Signed)
 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 S/p left lumpectomy on 06/12/21 with Dr. Aron. 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 Margins not involved. -She received adjuvant radiation under Dr. Izell from 07/18/21 - 08/14/21. -she started tamoxifen around late September/early October 2022. Due to hot flushes, I reduced dose to 10mg  daily, plan for a total of 3 years -last mammogram 04/16/22 was benign, screening breast MRI in November 2023 was also benign. -05/13/2024 - 3D diagnostic mammogram benign and she may switch to annual screening mammograms.  -She is due to have breast MRI wound 10/07/2024. This will be ordered as part of today's visit.     Plan Labs reviewed. -unremarkable CBC and CMP/ Reviewed diagnostic mammogram from 05/13/2024 which was benign. -transition to annual screening mammograms  Due for bilateral breast MRI in 09/2024. This was ordered as part of today's visit. Continue annual MRI exams due to high risk with DCIS.  Continue tamoxifen daily.  Labs and follow up in 1 year, sooner if needed.   The patient understands the plans discussed today and is in agreement with them.  She knows to contact our office if she develops concerns prior to her next appointment.  I provided 25 minutes of face-to-face time during this encounter and > 50% was spent counseling as documented under my assessment and plan.    Powell FORBES Lessen, NP  [place]  [facility] - A DEPT OF [facility] [address] [place] [address] Dept: [phone] Dept Fax: [phone]   Orders Placed This Encounter  Procedures   MR BREAST BILATERAL W WO CONTRAST INC CAD    Standing Status:   Future    Expected Date:   10/07/2024    Expiration Date:   09/13/2025    If indicated for the ordered procedure, I authorize the administration of contrast media per Radiology protocol:   Yes    What is the patient's sedation requirement?:   No Sedation    Does the patient have a pacemaker or implanted devices?:   No    Radiology Contrast Protocol - do NOT remove file path:   [email or web address]    Preferred imaging location?:   [facility] (table limit-550lbs)      CHIEF COMPLAINT:  CC: Ductal carcinoma in situ of left breast  Current Treatment: Tamoxifen (started 08/16/2021) Goal of 5 years  INTERVAL HISTORY:  Destiny Little is here today for repeat clinical assessment.  She last saw Lacie, NP on 08/27/2023.  She had 3D diagnostic mammogram on 05/13/2024.  Results were benign.  She is due for breast MRI in October 2025.  She denies problems or concerns.  She continues to tolerate tamoxifen well.  She has noticed no changes, lumps, or masses in either breast.  She denies chest pain, chest pressure, or shortness of breath. She denies headaches or visual disturbances. She denies abdominal pain, nausea, vomiting, or changes in bowel or bladder habits.  She denies fevers or  chills. She denies pain. Her appetite is good. Her weight has increased 12 pounds over last year.  I have reviewed the past medical history, past surgical history, social history and family history with the patient and they are unchanged from previous note.  ALLERGIES:  has no known allergies.  MEDICATIONS:  Current Outpatient Medications  Medication Sig Dispense Refill   Cholecalciferol (VITAMIN D3) 1.25 MG  (50000 UT) CAPS Take 1 capsule by mouth.     ibuprofen (ADVIL) 200 MG tablet Take 200 mg by mouth every 6 (six) hours as needed for moderate pain.     losartan-hydrochlorothiazide (HYZAAR) 50-12.5 MG tablet Take 1 tablet by mouth daily.     No current facility-administered medications for this visit.    HISTORY OF PRESENT ILLNESS:   Oncology History Overview Note  Cancer Staging Ductal carcinoma in situ (DCIS) of left breast Staging form: Breast, AJCC 8th Edition - Clinical stage from 05/15/2021: Stage 0 (cTis (DCIS), cN0, cM0, G3, ER+, PR-, HER2: Not Assessed) - Signed by Lanny Callander, MD on 05/21/2021 Stage prefix: Initial diagnosis Histologic grading system: 3 grade system - Pathologic stage from 06/12/2021: Stage Unknown (pTis (DCIS), pNX, G3, ER+, PR-, HER2-) - Signed by Lanny Callander, MD on 08/16/2021 Stage prefix: Initial diagnosis Histologic grading system: 3 grade system Residual tumor (R): R0 - None    Ductal carcinoma in situ (DCIS) of left breast  05/07/2021 Mammogram   IMPRESSION: 1. There is a 5 cm group of suspicious calcifications in the lower-inner quadrant of the left breast.   2. There are 2 small adjacent groups of calcifications in the upper-outer quadrant of the right breast measuring 0.6 and 0.5 cm respectively, which may represent fibrocystic changes.   05/15/2021 Initial Biopsy   Diagnosis 1. Breast, left, needle core biopsy, left breast LIQ anterior - DUCTAL CARCINOMA IN SITU WITH CALCIFICATIONS AND NECROSIS - SEE COMMENT 2. Breast, left, needle core biopsy, left breast LIQ posterior - DUCTAL CARCINOMA IN SITU WITH CALCIFICATIONS AND NECROSIS - SEE COMMENT Microscopic Comment 1. and 2. Based on the biopsy, the ductal carcinoma in situ has a comedo pattern, high nuclear grade and measures 0.2 cm in greatest linear extent. Prognostic markers (ER/PR) are pending and will be reported in an addendum. Dr. Belvie reviewed the case and agrees with the above diagnosis.  These results were called to [facility] on May 16, 2021.   05/15/2021 Cancer Staging   Staging form: Breast, AJCC 8th Edition - Clinical stage from 05/15/2021: Stage 0 (cTis (DCIS), cN0, cM0, G3, ER+, PR-, HER2: Not Assessed) - Signed by Lanny Callander, MD on 05/21/2021 Stage prefix: Initial diagnosis Histologic grading system: 3 grade system   05/15/2021 Receptors her2   ADDITIONAL INFORMATION: 1. PROGNOSTIC INDICATORS Results: IMMUNOHISTOCHEMICAL AND MORPHOMETRIC ANALYSIS PERFORMED MANUALLY Estrogen Receptor: 30%, POSITIVE, WEAK STAINING INTENSITY Progesterone Receptor: 0%, NEGATIVE   05/16/2021 Initial Diagnosis   Ductal carcinoma in situ (DCIS) of left breast   05/20/2021 Imaging   MRI breast IMPRESSION: Non masslike enhancement in the anterior aspect of the left breast consistent with post biopsy change/hematoma. No additional abnormal enhancement in the left breast is identified. No abnormal enhancement in the right breast.   05/23/2021 Pathology Results   Diagnosis 1. Breast, right, needle core biopsy, upper outer posterior (x clip) - COLUMNAR CELL AND FIBROCYSTIC CHANGES WITH APOCRINE METAPLASIA AND CALCIFICATIONS - NO MALIGNANCY IDENTIFIED 2. Breast, right, needle core biopsy, upper outer anterior (ribbon clip) - COLUMNAR CELL AND FIBROCYSTIC CHANGES WITH  APOCRINE METAPLASIA AND CALCIFICATIONS - PSEUDOANGIOMATOUS STROMAL HYPERPLASIA - NO MALIGNANCY IDENTIFIED   05/27/2021 Genetic Testing   Negative hereditary cancer genetic testing: no pathogenic variants detected in [facility] BRCAPlus Panel or CancerNext-Expanded +RNAinsight Panel.  The report dates are May 27, 2021 and June 02, 2021, respectively.   The BRCAplus panel offered by [employer] and includes sequencing and deletion/duplication analysis for the following 8 genes: ATM, BRCA1, BRCA2, CDH1, CHEK2, PALB2, PTEN, and TP53.  The CancerNext-Expanded gene panel offered by [facility] and includes sequencing,  rearrangement, and RNA analysis for the following 77 genes: AIP, ALK, APC, ATM, AXIN2, BAP1, BARD1, BLM, BMPR1A, BRCA1, BRCA2, BRIP1, CDC73, CDH1, CDK4, CDKN1B, CDKN2A, CHEK2, CTNNA1, DICER1, FANCC, FH, FLCN, GALNT12, KIF1B, LZTR1, MAX, MEN1, MET, MLH1, MSH2, MSH3, MSH6, MUTYH, NBN, NF1, NF2, NTHL1, PALB2, PHOX2B, PMS2, POT1, PRKAR1A, PTCH1, PTEN, RAD51C, RAD51D, RB1, RECQL, RET, SDHA, SDHAF2, SDHB, SDHC, SDHD, SMAD4, SMARCA4, SMARCB1, SMARCE1, STK11, SUFU, TMEM127, TP53, TSC1, TSC2, VHL and XRCC2 (sequencing and deletion/duplication); EGFR, EGLN1, HOXB13, KIT, MITF, PDGFRA, POLD1, and POLE (sequencing only); EPCAM and GREM1 (deletion/duplication only).    06/12/2021 Pathology Results   FINAL MICROSCOPIC DIAGNOSIS:   A. BREAST, LEFT, LUMPECTOMY:  - Ductal carcinoma in situ, 4.8 cm.  - Margins not involved.  - DCIS focally 0.1 cm from posterior margin and 0.5 cm from medial margin.  - Biopsy site and biopsy clip.  - See oncology table.    06/12/2021 Cancer Staging   Staging form: Breast, AJCC 8th Edition - Pathologic stage from 06/12/2021: Stage Unknown (pTis (DCIS), pNX, G3, ER+, PR-, HER2-) - Signed by Lanny Callander, MD on 08/16/2021 Stage prefix: Initial diagnosis Histologic grading system: 3 grade system Residual tumor (R): R0 - None   06/12/2021 Pathology Results   FINAL MICROSCOPIC DIAGNOSIS:   A. BREAST, LEFT, LUMPECTOMY:  - Ductal carcinoma in situ, 4.8 cm.  - Margins not involved.  - DCIS focally 0.1 cm from posterior margin and 0.5 cm from medial margin.  - Biopsy site and biopsy clip.    06/12/2021 Surgery   Left lumpectomy   07/18/2021 - 08/14/2021 Radiation Therapy   Left breast radiation, under Dr. Izell   08/16/2021 - 08/16/2026 Anti-estrogen oral therapy   Tamoxifen to be taken daily for 5 years   11/18/2021 Survivorship   SCP delivered by Lacie Burton, NP   05/13/2024 Mammogram   3D diagnostic mammogram  IMPRESSION: Stable post treatment changes of the left breast. No  mammographic evidence of malignancy in either breast.  RECOMMENDATION: Routine annual screening mammogram in 1 year. BI-RADS CATEGORY  2: Benign.        REVIEW OF SYSTEMS:   Constitutional: Denies fevers, chills or abnormal weight loss Eyes: Denies blurriness of vision Ears, nose, mouth, throat, and face: Denies mucositis or sore throat Respiratory: Denies cough, dyspnea or wheezes Cardiovascular: Denies palpitation, chest discomfort or lower extremity swelling Gastrointestinal:  Denies nausea, heartburn or change in bowel habits Skin: Denies abnormal skin rashes Lymphatics: Denies new lymphadenopathy or easy bruising Neurological:Denies numbness, tingling or new weaknesses Behavioral/Psych: Mood is stable, no new changes  All other systems were reviewed with the patient and are negative.   VITALS:   Today's Vitals   09/02/24 0854 09/02/24 0900  BP: 110/82   Pulse: 78   Resp: 16   Temp: 97.8 F (36.6 C)   TempSrc: Temporal   SpO2: 98%   Weight: 276 lb 1.6 oz (125.2 kg)   Height: 5' 3 (1.6 m)   PainSc:  0-No pain   Body mass index is 48.91 kg/m.   Wt Readings from Last 3 Encounters:  09/02/24 276 lb 1.6 oz (125.2 kg)  08/27/23 264 lb 11.2 oz (120.1 kg)  03/06/23 253 lb 11.2 oz (115.1 kg)    Body mass index is 48.91 kg/m.  Performance status (ECOG): 0 - Asymptomatic  PHYSICAL EXAM:   GENERAL:alert, no distress and comfortable SKIN: skin color, texture, turgor are normal, no rashes or significant lesions EYES: normal, Conjunctiva are pink and non-injected, sclera clear OROPHARYNX:no exudate, no erythema and lips, buccal mucosa, and tongue normal  NECK: supple, thyroid normal size, non-tender, without nodularity LYMPH:  no palpable lymphadenopathy in the cervical, axillary or inguinal LUNGS: clear to auscultation and percussion with normal breathing effort HEART: regular rate & rhythm and no murmurs and no lower extremity edema ABDOMEN:abdomen soft,  non-tender and normal bowel sounds Musculoskeletal:no cyanosis of digits and no clubbing  NEURO: alert & oriented x 3 with fluent speech, no focal motor/sensory deficits BREAST: There is well-healed lumpectomy scar on the left breast.  There are no palpable masses or lumps.  There is no nipple inversion or nipple discharge.  There is no axillary lymphadenopathy on the left.  The right breast is without masses or lumps.  There is no nipple inversion or nipple discharge.  There is no axillary lymphadenopathy on the right.  LABORATORY DATA:  I have reviewed the data as listed    Component Value Date/Time   NA 139 09/02/2024 0842   NA 141 10/28/2022 0820   K 4.0 09/02/2024 0842   CL 105 09/02/2024 0842   CO2 28 09/02/2024 0842   GLUCOSE 106 (H) 09/02/2024 0842   BUN 16 09/02/2024 0842   BUN 14 10/28/2022 0820   CREATININE 0.56 09/02/2024 0842   CALCIUM 9.2 09/02/2024 0842   PROT 7.1 09/02/2024 0842   PROT 6.7 10/28/2022 0820   ALBUMIN 3.9 09/02/2024 0842   ALBUMIN 3.9 10/28/2022 0820   AST 17 09/02/2024 0842   ALT 17 09/02/2024 0842   ALKPHOS 111 09/02/2024 0842   BILITOT 0.5 09/02/2024 0842   GFRNONAA >60 09/02/2024 0842     Lab Results  Component Value Date   WBC 5.6 09/02/2024   NEUTROABS 2.8 09/02/2024   HGB 12.6 09/02/2024   HCT 38.1 09/02/2024   MCV 88.8 09/02/2024   PLT 399 09/02/2024

## 2024-09-01 NOTE — Assessment & Plan Note (Addendum)
 grade III, ER weakly+/PR-  -seen on screening mammogram. S/p left lumpectomy on 06/12/21 with Dr. Aron. 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 Margins not involved. -She received adjuvant radiation under Dr. Izell from 07/18/21 - 08/14/21. -she started tamoxifen around late September/early October 2022. Due to hot flushes, I reduced dose to 10mg  daily, plan for a total of 3 years -last mammogram 04/16/22 was benign, screening breast MRI in November 2023 was also benign. -05/13/2024 - 3D diagnostic mammogram benign and she may switch to annual screening mammograms.  -She is due to have breast MRI wound 10/07/2024. This will be ordered as part of today's visit.

## 2024-09-02 ENCOUNTER — Inpatient Hospital Stay

## 2024-09-02 ENCOUNTER — Inpatient Hospital Stay (HOSPITAL_BASED_OUTPATIENT_CLINIC_OR_DEPARTMENT_OTHER): Admitting: Nurse Practitioner

## 2024-09-02 VITALS — BP 110/82 | HR 78 | Temp 97.8°F | Resp 16 | Ht 63.0 in | Wt 276.1 lb

## 2024-09-02 DIAGNOSIS — D0512 Intraductal carcinoma in situ of left breast: Secondary | ICD-10-CM

## 2024-09-02 DIAGNOSIS — Z79810 Long term (current) use of selective estrogen receptor modulators (SERMs): Secondary | ICD-10-CM | POA: Diagnosis not present

## 2024-09-02 LAB — CMP (CANCER CENTER ONLY)
ALT: 17 U/L (ref 0–44)
AST: 17 U/L (ref 15–41)
Albumin: 3.9 g/dL (ref 3.5–5.0)
Alkaline Phosphatase: 111 U/L (ref 38–126)
Anion gap: 6 (ref 5–15)
BUN: 16 mg/dL (ref 6–20)
CO2: 28 mmol/L (ref 22–32)
Calcium: 9.2 mg/dL (ref 8.9–10.3)
Chloride: 105 mmol/L (ref 98–111)
Creatinine: 0.56 mg/dL (ref 0.44–1.00)
GFR, Estimated: >60 mL/min (ref >60)
Glucose, Bld: 106 mg/dL — ABNORMAL HIGH (ref 70–99)
Potassium: 4 mmol/L (ref 3.5–5.1)
Sodium: 139 mmol/L (ref 135–145)
Total Bilirubin: 0.5 mg/dL (ref 0.0–1.2)
Total Protein: 7.1 g/dL (ref 6.5–8.1)

## 2024-09-02 LAB — CBC WITH DIFFERENTIAL (CANCER CENTER ONLY)
Abs Immature Granulocytes: 0.01 K/uL (ref 0.00–0.07)
Basophils Absolute: 0 K/uL (ref 0.0–0.1)
Basophils Relative: 0 %
Eosinophils Absolute: 0.1 K/uL (ref 0.0–0.5)
Eosinophils Relative: 2 %
HCT: 38.1 % (ref 36.0–46.0)
Hemoglobin: 12.6 g/dL (ref 12.0–15.0)
Immature Granulocytes: 0 %
Lymphocytes Relative: 41 %
Lymphs Abs: 2.3 K/uL (ref 0.7–4.0)
MCH: 29.4 pg (ref 26.0–34.0)
MCHC: 33.1 g/dL (ref 30.0–36.0)
MCV: 88.8 fL (ref 80.0–100.0)
Monocytes Absolute: 0.4 K/uL (ref 0.1–1.0)
Monocytes Relative: 7 %
Neutro Abs: 2.8 K/uL (ref 1.7–7.7)
Neutrophils Relative %: 50 %
Platelet Count: 399 K/uL (ref 150–400)
RBC: 4.29 MIL/uL (ref 3.87–5.11)
RDW: 13.5 % (ref 11.5–15.5)
WBC Count: 5.6 K/uL (ref 4.0–10.5)
nRBC: 0 % (ref 0.0–0.2)

## 2024-09-13 ENCOUNTER — Encounter: Payer: Self-pay | Admitting: Nurse Practitioner

## 2024-10-17 ENCOUNTER — Ambulatory Visit: Admitting: Podiatry

## 2024-10-21 ENCOUNTER — Encounter: Payer: Self-pay | Admitting: Nurse Practitioner

## 2024-10-25 ENCOUNTER — Encounter (HOSPITAL_BASED_OUTPATIENT_CLINIC_OR_DEPARTMENT_OTHER): Payer: Self-pay | Admitting: Internal Medicine

## 2024-10-25 DIAGNOSIS — R0681 Apnea, not elsewhere classified: Secondary | ICD-10-CM

## 2024-10-25 DIAGNOSIS — R0683 Snoring: Secondary | ICD-10-CM

## 2024-11-04 ENCOUNTER — Ambulatory Visit
Admission: RE | Admit: 2024-11-04 | Discharge: 2024-11-04 | Disposition: A | Discharge status: Home / Self Care | Source: Ambulatory Visit | Attending: Nurse Practitioner | Admitting: Nurse Practitioner

## 2024-11-04 DIAGNOSIS — D0512 Intraductal carcinoma in situ of left breast: Secondary | ICD-10-CM

## 2024-12-29 ENCOUNTER — Ambulatory Visit (HOSPITAL_BASED_OUTPATIENT_CLINIC_OR_DEPARTMENT_OTHER): Admitting: Pulmonary Disease

## 2025-02-07 ENCOUNTER — Other Ambulatory Visit

## 2025-05-02 ENCOUNTER — Ambulatory Visit: Admitting: Family Medicine

## 2025-09-07 ENCOUNTER — Other Ambulatory Visit

## 2025-09-07 ENCOUNTER — Ambulatory Visit: Admitting: Hematology
